# Patient Record
Sex: Male | Born: 1990 | Race: Black or African American | Hispanic: No | Marital: Single | Smoking: Current every day smoker
Health system: Southern US, Community
[De-identification: ages and names within clinical notes are randomized; demographics above are authoritative.]

## PROBLEM LIST (undated history)

## (undated) DIAGNOSIS — Z9889 Other specified postprocedural states: Secondary | ICD-10-CM

## (undated) DIAGNOSIS — W3400XA Accidental discharge from unspecified firearms or gun, initial encounter: Secondary | ICD-10-CM

---
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
## 2011-05-17 ENCOUNTER — Emergency Department (HOSPITAL_COMMUNITY): Payer: Self-pay

## 2011-05-17 ENCOUNTER — Inpatient Hospital Stay (HOSPITAL_COMMUNITY)
Admission: EM | Admit: 2011-05-17 | Discharge: 2011-05-19 | DRG: 605 | Disposition: A | Discharge status: Home / Self Care | Payer: Self-pay | Attending: General Surgery | Admitting: General Surgery

## 2011-05-17 DIAGNOSIS — Z23 Encounter for immunization: Secondary | ICD-10-CM

## 2011-05-17 DIAGNOSIS — D62 Acute posthemorrhagic anemia: Secondary | ICD-10-CM | POA: Diagnosis present

## 2011-05-17 DIAGNOSIS — S21209A Unspecified open wound of unspecified back wall of thorax without penetration into thoracic cavity, initial encounter: Principal | ICD-10-CM | POA: Diagnosis present

## 2011-05-17 LAB — TYPE AND SCREEN: Unit division: 0

## 2011-05-17 LAB — POCT I-STAT, CHEM 8
BUN: 9 mg/dL (ref 6–23)
Calcium, Ion: 1.11 mmol/L — ABNORMAL LOW (ref 1.12–1.32)
Creatinine, Ser: 1.1 mg/dL (ref 0.4–1.5)
Sodium: 142 mEq/L (ref 135–145)
TCO2: 16 mmol/L (ref 0–100)

## 2011-05-17 LAB — CBC
MCH: 33.9 pg (ref 26.0–34.0)
MCV: 93.6 fL (ref 78.0–100.0)
Platelets: 247 10*3/uL (ref 150–400)
RBC: 4.66 MIL/uL (ref 4.22–5.81)

## 2011-05-18 ENCOUNTER — Observation Stay (HOSPITAL_COMMUNITY): Payer: Self-pay

## 2011-05-18 LAB — COMPREHENSIVE METABOLIC PANEL
ALT: 8 U/L (ref 0–53)
AST: 18 U/L (ref 0–37)
Alkaline Phosphatase: 74 U/L (ref 39–117)
CO2: 17 mEq/L — ABNORMAL LOW (ref 19–32)
Chloride: 100 mEq/L (ref 96–112)
Creatinine, Ser: 1.03 mg/dL (ref 0.4–1.5)
GFR calc Af Amer: >60 mL/min (ref >60)
GFR calc non Af Amer: >60 mL/min (ref >60)
Sodium: 142 mEq/L (ref 135–145)
Total Bilirubin: 0.6 mg/dL (ref 0.3–1.2)

## 2011-05-18 LAB — CBC
MCHC: 35.8 g/dL (ref 30.0–36.0)
Platelets: 186 10*3/uL (ref 150–400)
RDW: 12.1 % (ref 11.5–15.5)

## 2011-05-19 LAB — CBC
Platelets: 185 10*3/uL (ref 150–400)
RBC: 3.68 MIL/uL — ABNORMAL LOW (ref 4.22–5.81)
WBC: 8.2 10*3/uL (ref 4.0–10.5)

## 2011-05-19 LAB — BASIC METABOLIC PANEL
Chloride: 102 mEq/L (ref 96–112)
GFR calc Af Amer: >60 mL/min (ref >60)
GFR calc non Af Amer: >60 mL/min (ref >60)
Potassium: 3.6 mEq/L (ref 3.5–5.1)
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
## 2014-02-27 HISTORY — PX: OTHER SURGICAL HISTORY: SHX169

## 2014-03-07 ENCOUNTER — Emergency Department (HOSPITAL_COMMUNITY): Payer: No Typology Code available for payment source | Admitting: Registered Nurse

## 2014-03-07 ENCOUNTER — Ambulatory Visit (HOSPITAL_COMMUNITY)
Admission: EM | Admit: 2014-03-07 | Discharge: 2014-03-07 | Disposition: A | Discharge status: Other Acute Inpatient Hospital | Payer: No Typology Code available for payment source | Source: Home / Self Care | Attending: Emergency Medicine | Admitting: Emergency Medicine

## 2014-03-07 ENCOUNTER — Encounter (HOSPITAL_COMMUNITY): Payer: No Typology Code available for payment source | Admitting: Registered Nurse

## 2014-03-07 ENCOUNTER — Inpatient Hospital Stay (HOSPITAL_COMMUNITY): Payer: No Typology Code available for payment source

## 2014-03-07 ENCOUNTER — Encounter (HOSPITAL_COMMUNITY)
Admission: EM | Disposition: A | Discharge status: Other Acute Inpatient Hospital | Payer: Self-pay | Source: Home / Self Care | Attending: Emergency Medicine

## 2014-03-07 ENCOUNTER — Encounter (HOSPITAL_COMMUNITY): Payer: Self-pay | Admitting: Registered Nurse

## 2014-03-07 ENCOUNTER — Emergency Department (HOSPITAL_COMMUNITY): Payer: No Typology Code available for payment source

## 2014-03-07 ENCOUNTER — Inpatient Hospital Stay (HOSPITAL_COMMUNITY)
Admission: EM | Admit: 2014-03-07 | Discharge: 2014-03-12 | DRG: 958 | Disposition: A | Discharge status: Home / Self Care | Payer: No Typology Code available for payment source | Attending: General Surgery | Admitting: General Surgery

## 2014-03-07 DIAGNOSIS — J986 Disorders of diaphragm: Secondary | ICD-10-CM

## 2014-03-07 DIAGNOSIS — S21109A Unspecified open wound of unspecified front wall of thorax without penetration into thoracic cavity, initial encounter: Secondary | ICD-10-CM | POA: Insufficient documentation

## 2014-03-07 DIAGNOSIS — S21139A Puncture wound without foreign body of unspecified front wall of thorax without penetration into thoracic cavity, initial encounter: Principal | ICD-10-CM

## 2014-03-07 DIAGNOSIS — R61 Generalized hyperhidrosis: Secondary | ICD-10-CM | POA: Insufficient documentation

## 2014-03-07 DIAGNOSIS — J9383 Other pneumothorax: Secondary | ICD-10-CM

## 2014-03-07 DIAGNOSIS — S270XXA Traumatic pneumothorax, initial encounter: Secondary | ICD-10-CM | POA: Diagnosis present

## 2014-03-07 DIAGNOSIS — S3609XA Other injury of spleen, initial encounter: Secondary | ICD-10-CM | POA: Diagnosis present

## 2014-03-07 DIAGNOSIS — S31109A Unspecified open wound of abdominal wall, unspecified quadrant without penetration into peritoneal cavity, initial encounter: Principal | ICD-10-CM | POA: Diagnosis present

## 2014-03-07 DIAGNOSIS — R5083 Postvaccination fever: Secondary | ICD-10-CM | POA: Diagnosis present

## 2014-03-07 DIAGNOSIS — S36039A Unspecified laceration of spleen, initial encounter: Secondary | ICD-10-CM

## 2014-03-07 DIAGNOSIS — F17200 Nicotine dependence, unspecified, uncomplicated: Secondary | ICD-10-CM | POA: Insufficient documentation

## 2014-03-07 DIAGNOSIS — K560 Paralytic ileus: Secondary | ICD-10-CM | POA: Diagnosis not present

## 2014-03-07 DIAGNOSIS — Y849 Medical procedure, unspecified as the cause of abnormal reaction of the patient, or of later complication, without mention of misadventure at the time of the procedure: Secondary | ICD-10-CM | POA: Diagnosis present

## 2014-03-07 DIAGNOSIS — S31139A Puncture wound of abdominal wall without foreign body, unspecified quadrant without penetration into peritoneal cavity, initial encounter: Secondary | ICD-10-CM

## 2014-03-07 DIAGNOSIS — W3400XA Accidental discharge from unspecified firearms or gun, initial encounter: Secondary | ICD-10-CM

## 2014-03-07 DIAGNOSIS — I9589 Other hypotension: Secondary | ICD-10-CM

## 2014-03-07 DIAGNOSIS — D62 Acute posthemorrhagic anemia: Secondary | ICD-10-CM | POA: Diagnosis present

## 2014-03-07 DIAGNOSIS — Y9229 Other specified public building as the place of occurrence of the external cause: Secondary | ICD-10-CM | POA: Insufficient documentation

## 2014-03-07 DIAGNOSIS — S27809A Unspecified injury of diaphragm, initial encounter: Secondary | ICD-10-CM | POA: Diagnosis present

## 2014-03-07 DIAGNOSIS — S36899A Unspecified injury of other intra-abdominal organs, initial encounter: Secondary | ICD-10-CM

## 2014-03-07 DIAGNOSIS — F1210 Cannabis abuse, uncomplicated: Secondary | ICD-10-CM | POA: Diagnosis present

## 2014-03-07 DIAGNOSIS — Z9081 Acquired absence of spleen: Secondary | ICD-10-CM

## 2014-03-07 DIAGNOSIS — S3600XA Unspecified injury of spleen, initial encounter: Secondary | ICD-10-CM | POA: Diagnosis present

## 2014-03-07 DIAGNOSIS — S272XXA Traumatic hemopneumothorax, initial encounter: Secondary | ICD-10-CM | POA: Diagnosis present

## 2014-03-07 HISTORY — PX: SPLENECTOMY, TOTAL: SHX788

## 2014-03-07 HISTORY — PX: LAPAROTOMY: SHX154

## 2014-03-07 LAB — CBC WITH DIFFERENTIAL/PLATELET
BASOS ABS: 0 10*3/uL (ref 0.0–0.1)
Basophils Relative: 0 % (ref 0–1)
Eosinophils Absolute: 0.1 10*3/uL (ref 0.0–0.7)
Eosinophils Relative: 1 % (ref 0–5)
HCT: 41.5 % (ref 39.0–52.0)
Hemoglobin: 14.6 g/dL (ref 13.0–17.0)
Lymphocytes Relative: 21 % (ref 12–46)
Lymphs Abs: 2.3 10*3/uL (ref 0.7–4.0)
MCH: 34.1 pg — ABNORMAL HIGH (ref 26.0–34.0)
MCHC: 35.2 g/dL (ref 30.0–36.0)
MCV: 97 fL (ref 78.0–100.0)
Monocytes Absolute: 0.5 10*3/uL (ref 0.1–1.0)
Monocytes Relative: 4 % (ref 3–12)
NEUTROS ABS: 8.2 10*3/uL — AB (ref 1.7–7.7)
Neutrophils Relative %: 74 % (ref 43–77)
PLATELETS: 199 10*3/uL (ref 150–400)
RBC: 4.28 MIL/uL (ref 4.22–5.81)
RDW: 12.6 % (ref 11.5–15.5)
WBC: 11 10*3/uL — ABNORMAL HIGH (ref 4.0–10.5)

## 2014-03-07 LAB — TYPE AND SCREEN
ABO/RH(D): O POS
Antibody Screen: NEGATIVE
UNIT DIVISION: 0
Unit division: 0

## 2014-03-07 LAB — PREPARE FRESH FROZEN PLASMA
UNIT DIVISION: 0
Unit division: 0
Unit division: 0
Unit division: 0

## 2014-03-07 LAB — CBC
HCT: 40.6 % (ref 39.0–52.0)
Hemoglobin: 14.2 g/dL (ref 13.0–17.0)
MCH: 33.3 pg (ref 26.0–34.0)
MCHC: 35 g/dL (ref 30.0–36.0)
MCV: 95.1 fL (ref 78.0–100.0)
Platelets: 175 10*3/uL (ref 150–400)
RBC: 4.27 MIL/uL (ref 4.22–5.81)
RDW: 14.9 % (ref 11.5–15.5)
WBC: 30.9 10*3/uL — ABNORMAL HIGH (ref 4.0–10.5)

## 2014-03-07 LAB — I-STAT CHEM 8, ED
BUN: 12 mg/dL (ref 6–23)
CHLORIDE: 101 meq/L (ref 96–112)
Calcium, Ion: 1.12 mmol/L (ref 1.12–1.23)
Creatinine, Ser: 1.2 mg/dL (ref 0.50–1.35)
Glucose, Bld: 154 mg/dL — ABNORMAL HIGH (ref 70–99)
HEMATOCRIT: 46 % (ref 39.0–52.0)
Hemoglobin: 15.6 g/dL (ref 13.0–17.0)
Potassium: 3 mEq/L — ABNORMAL LOW (ref 3.7–5.3)
SODIUM: 140 meq/L (ref 137–147)
TCO2: 22 mmol/L (ref 0–100)

## 2014-03-07 LAB — MASSIVE TRANSFUSION PROTOCOL ORDER (BLOOD BANK NOTIFICATION)

## 2014-03-07 LAB — BASIC METABOLIC PANEL
BUN: 9 mg/dL (ref 6–23)
CALCIUM: 7.5 mg/dL — AB (ref 8.4–10.5)
CHLORIDE: 103 meq/L (ref 96–112)
CO2: 20 mEq/L (ref 19–32)
CREATININE: 0.73 mg/dL (ref 0.50–1.35)
GFR calc non Af Amer: >90 mL/min (ref >90)
Glucose, Bld: 116 mg/dL — ABNORMAL HIGH (ref 70–99)
Potassium: 4.3 mEq/L (ref 3.7–5.3)
Sodium: 139 mEq/L (ref 137–147)

## 2014-03-07 LAB — COMPREHENSIVE METABOLIC PANEL
ALBUMIN: 4.3 g/dL (ref 3.5–5.2)
ALK PHOS: 70 U/L (ref 39–117)
ALT: 6 U/L (ref 0–53)
AST: 14 U/L (ref 0–37)
BUN: 12 mg/dL (ref 6–23)
CALCIUM: 8.7 mg/dL (ref 8.4–10.5)
CO2: 21 mEq/L (ref 19–32)
Chloride: 101 mEq/L (ref 96–112)
Creatinine, Ser: 1.01 mg/dL (ref 0.50–1.35)
GFR calc Af Amer: >90 mL/min (ref >90)
GFR calc non Af Amer: >90 mL/min (ref >90)
Glucose, Bld: 155 mg/dL — ABNORMAL HIGH (ref 70–99)
POTASSIUM: 3.4 meq/L — AB (ref 3.7–5.3)
SODIUM: 142 meq/L (ref 137–147)
TOTAL PROTEIN: 6.9 g/dL (ref 6.0–8.3)
Total Bilirubin: 0.3 mg/dL (ref 0.3–1.2)

## 2014-03-07 LAB — POCT I-STAT 4, (NA,K, GLUC, HGB,HCT)
GLUCOSE: 165 mg/dL — AB (ref 70–99)
HCT: 33 % — ABNORMAL LOW (ref 39.0–52.0)
Hemoglobin: 11.2 g/dL — ABNORMAL LOW (ref 13.0–17.0)
Potassium: 3.8 mEq/L (ref 3.7–5.3)
Sodium: 141 mEq/L (ref 137–147)

## 2014-03-07 LAB — ETHANOL: ALCOHOL ETHYL (B): 71 mg/dL — AB (ref 0–11)

## 2014-03-07 LAB — I-STAT CG4 LACTIC ACID, ED: LACTIC ACID, VENOUS: 5.07 mmol/L — AB (ref 0.5–2.2)

## 2014-03-07 LAB — ABO/RH
ABO/RH(D): O POS
ABO/RH(D): O POS

## 2014-03-07 LAB — PREPARE RBC (CROSSMATCH)

## 2014-03-07 LAB — MRSA PCR SCREENING: MRSA by PCR: NEGATIVE

## 2014-03-07 SURGERY — LAPAROTOMY, EXPLORATORY
Anesthesia: General

## 2014-03-07 MED ORDER — HYDROMORPHONE HCL PF 1 MG/ML IJ SOLN
0.2500 mg | INTRAMUSCULAR | Status: DC | PRN
  Administered 2014-03-07 (×4): 0.5 mg via INTRAVENOUS

## 2014-03-07 MED ORDER — GLYCOPYRROLATE 0.2 MG/ML IJ SOLN
INTRAMUSCULAR | Status: DC | PRN
  Administered 2014-03-07: .6 mg via INTRAVENOUS

## 2014-03-07 MED ORDER — ONDANSETRON HCL 4 MG/2ML IJ SOLN
INTRAMUSCULAR | Status: DC | PRN
  Administered 2014-03-07: 4 mg via INTRAVENOUS

## 2014-03-07 MED ORDER — SODIUM CHLORIDE 0.9 % IV SOLN
INTRAVENOUS | Status: DC | PRN
  Administered 2014-03-07: 03:00:00 via INTRAVENOUS

## 2014-03-07 MED ORDER — PROMETHAZINE HCL 25 MG/ML IJ SOLN: 6.2500 mg | INTRAMUSCULAR | Status: DC | PRN

## 2014-03-07 MED ORDER — CEFAZOLIN SODIUM-DEXTROSE 2-3 GM-% IV SOLR
2.0000 g | Freq: Once | INTRAVENOUS | Status: DC
  Administered 2014-03-07: 2 g via INTRAVENOUS

## 2014-03-07 MED ORDER — ONDANSETRON HCL 4 MG/2ML IJ SOLN: 4.0000 mg | Freq: Once | INTRAMUSCULAR | Status: DC

## 2014-03-07 MED ORDER — KETAMINE HCL 10 MG/ML IJ SOLN
INTRAMUSCULAR | Status: DC | PRN
  Administered 2014-03-07 (×2): 20 mg via INTRAVENOUS

## 2014-03-07 MED ORDER — PANTOPRAZOLE SODIUM 40 MG PO TBEC: 40.0000 mg | DELAYED_RELEASE_TABLET | Freq: Every day | ORAL | Status: DC

## 2014-03-07 MED ORDER — PANTOPRAZOLE SODIUM 40 MG PO TBEC: 40.0000 mg | DELAYED_RELEASE_TABLET | ORAL | Status: DC

## 2014-03-07 MED ORDER — HYDROMORPHONE HCL PF 1 MG/ML IJ SOLN
INTRAMUSCULAR | Status: AC
  Filled 2014-03-07: qty 1

## 2014-03-07 MED ORDER — LIDOCAINE HCL (CARDIAC) 20 MG/ML IV SOLN
INTRAVENOUS | Status: DC | PRN
  Administered 2014-03-07: 100 mg via INTRAVENOUS

## 2014-03-07 MED ORDER — ROCURONIUM BROMIDE 100 MG/10ML IV SOLN
INTRAVENOUS | Status: DC | PRN
  Administered 2014-03-07: 50 mg via INTRAVENOUS

## 2014-03-07 MED ORDER — KCL IN DEXTROSE-NACL 20-5-0.9 MEQ/L-%-% IV SOLN
INTRAVENOUS | Status: DC
  Administered 2014-03-07 – 2014-03-09 (×5): via INTRAVENOUS
  Filled 2014-03-07 (×7): qty 1000

## 2014-03-07 MED ORDER — LACTATED RINGERS IV SOLN
INTRAVENOUS | Status: DC | PRN
  Administered 2014-03-07: 03:00:00 via INTRAVENOUS

## 2014-03-07 MED ORDER — MORPHINE SULFATE 4 MG/ML IJ SOLN
4.0000 mg | INTRAMUSCULAR | Status: DC | PRN
  Administered 2014-03-07 – 2014-03-09 (×12): 4 mg via INTRAVENOUS
  Filled 2014-03-07 (×13): qty 1

## 2014-03-07 MED ORDER — ONDANSETRON HCL 4 MG PO TABS: 4.0000 mg | ORAL_TABLET | Freq: Four times a day (QID) | ORAL | Status: DC | PRN

## 2014-03-07 MED ORDER — PNEUMOCOCCAL VAC POLYVALENT 25 MCG/0.5ML IJ INJ
0.5000 mL | INJECTION | INTRAMUSCULAR | Status: AC
  Administered 2014-03-08: 0.5 mL via INTRAMUSCULAR
  Filled 2014-03-07: qty 0.5

## 2014-03-07 MED ORDER — ONDANSETRON HCL 4 MG/2ML IJ SOLN: 4.0000 mg | Freq: Four times a day (QID) | INTRAMUSCULAR | Status: DC | PRN

## 2014-03-07 MED ORDER — PANTOPRAZOLE SODIUM 40 MG IV SOLR
40.0000 mg | INTRAVENOUS | Status: DC
  Administered 2014-03-07 – 2014-03-08 (×2): 40 mg via INTRAVENOUS
  Filled 2014-03-07 (×3): qty 40

## 2014-03-07 MED ORDER — PANTOPRAZOLE SODIUM 40 MG IV SOLR: 40.0000 mg | INTRAVENOUS | Status: DC

## 2014-03-07 MED ORDER — KCL IN DEXTROSE-NACL 20-5-0.9 MEQ/L-%-% IV SOLN: INTRAVENOUS | Status: DC

## 2014-03-07 MED ORDER — LACTATED RINGERS IV SOLN: INTRAVENOUS | Status: DC

## 2014-03-07 MED ORDER — PHENYLEPHRINE HCL 10 MG/ML IJ SOLN
20.0000 mg | INTRAVENOUS | Status: DC | PRN
  Administered 2014-03-07: 5 ug/min via INTRAVENOUS

## 2014-03-07 MED ORDER — SUCCINYLCHOLINE CHLORIDE 20 MG/ML IJ SOLN
INTRAMUSCULAR | Status: DC | PRN
  Administered 2014-03-07: 100 mg via INTRAVENOUS

## 2014-03-07 MED ORDER — SODIUM CHLORIDE 0.9 % IR SOLN
Status: DC | PRN
  Administered 2014-03-07 (×4): 1000 mL

## 2014-03-07 MED ORDER — ONDANSETRON HCL 4 MG PO TABS: 4.0000 mg | ORAL_TABLET | Freq: Once | ORAL | Status: DC

## 2014-03-07 MED ORDER — INFLUENZA VAC SPLIT QUAD 0.5 ML IM SUSP
0.5000 mL | INTRAMUSCULAR | Status: AC
  Administered 2014-03-08: 0.5 mL via INTRAMUSCULAR
  Filled 2014-03-07: qty 0.5

## 2014-03-07 MED ORDER — NEOSTIGMINE METHYLSULFATE 1 MG/ML IJ SOLN
INTRAMUSCULAR | Status: DC | PRN
  Administered 2014-03-07: 4 mg via INTRAVENOUS

## 2014-03-07 MED ORDER — FENTANYL CITRATE 0.05 MG/ML IJ SOLN
INTRAMUSCULAR | Status: DC | PRN
  Administered 2014-03-07: 50 ug via INTRAVENOUS
  Administered 2014-03-07: 100 ug via INTRAVENOUS
  Administered 2014-03-07 (×2): 50 ug via INTRAVENOUS

## 2014-03-07 MED ORDER — HYDROMORPHONE HCL PF 1 MG/ML IJ SOLN
INTRAMUSCULAR | Status: DC | PRN
  Administered 2014-03-07: 0.5 mg via INTRAVENOUS

## 2014-03-07 MED ORDER — ETOMIDATE 2 MG/ML IV SOLN
INTRAVENOUS | Status: DC | PRN
  Administered 2014-03-07: 16 mg via INTRAVENOUS

## 2014-03-07 SURGICAL SUPPLY — 42 items
APPLICATOR COTTON TIP 6IN STRL (MISCELLANEOUS) ×3 IMPLANT
BLADE EXTENDED COATED 6.5IN (ELECTRODE) ×2 IMPLANT
BLADE HEX COATED 2.75 (ELECTRODE) ×3 IMPLANT
CANISTER SUCTION 2500CC (MISCELLANEOUS) ×3 IMPLANT
COVER MAYO STAND STRL (DRAPES) ×2 IMPLANT
DRAIN CHANNEL 19F RND (DRAIN) ×2 IMPLANT
DRAPE LAPAROSCOPIC ABDOMINAL (DRAPES) ×3 IMPLANT
DRAPE WARM FLUID 44X44 (DRAPE) ×2 IMPLANT
ELECT REM PT RETURN 9FT ADLT (ELECTROSURGICAL) ×3
ELECTRODE REM PT RTRN 9FT ADLT (ELECTROSURGICAL) ×1 IMPLANT
EVACUATOR SILICONE 100CC (DRAIN) ×2 IMPLANT
GAUZE VASELINE 1X8 (GAUZE/BANDAGES/DRESSINGS) ×2 IMPLANT
GLOVE BIO SURGEON STRL SZ7.5 (GLOVE) ×6 IMPLANT
GLOVE BIOGEL PI IND STRL 7.0 (GLOVE) ×1 IMPLANT
GLOVE BIOGEL PI INDICATOR 7.0 (GLOVE) ×4
GLOVE SURG SS PI 8.5 STRL IVOR (GLOVE) ×4
GLOVE SURG SS PI 8.5 STRL STRW (GLOVE) IMPLANT
GOWN STRL REUS W/ TWL XL LVL3 (GOWN DISPOSABLE) ×1 IMPLANT
GOWN STRL REUS W/TWL LRG LVL3 (GOWN DISPOSABLE) ×3 IMPLANT
GOWN STRL REUS W/TWL XL LVL3 (GOWN DISPOSABLE) ×6 IMPLANT
KIT BASIN OR (CUSTOM PROCEDURE TRAY) ×3 IMPLANT
NS IRRIG 1000ML POUR BTL (IV SOLUTION) ×11 IMPLANT
PACK GENERAL/GYN (CUSTOM PROCEDURE TRAY) ×3 IMPLANT
SPONGE GAUZE 4X4 12PLY (GAUZE/BANDAGES/DRESSINGS) ×3 IMPLANT
SPONGE LAP 18X18 X RAY DECT (DISPOSABLE) ×10 IMPLANT
STAPLER VISISTAT 35W (STAPLE) ×5 IMPLANT
SUCTION POOLE TIP (SUCTIONS) ×2 IMPLANT
SUT ETHILON 2 0 PS N (SUTURE) ×2 IMPLANT
SUT PDS AB 1 CTX 36 (SUTURE) ×4 IMPLANT
SUT SILK 0 SH 30 (SUTURE) ×2 IMPLANT
SUT SILK 2 0 (SUTURE) ×6
SUT SILK 2 0 SH CR/8 (SUTURE) ×4 IMPLANT
SUT SILK 2-0 18XBRD TIE 12 (SUTURE) IMPLANT
SUT SILK 3 0 (SUTURE)
SUT SILK 3 0 SH CR/8 (SUTURE) IMPLANT
SUT SILK 3-0 18XBRD TIE 12 (SUTURE) IMPLANT
SYSTEM SAHARA CHEST DRAIN ATS (WOUND CARE) ×2 IMPLANT
TAPE CLOTH SURG 4X10 WHT LF (GAUZE/BANDAGES/DRESSINGS) ×2 IMPLANT
TOWEL OR 17X26 10 PK STRL BLUE (TOWEL DISPOSABLE) ×6 IMPLANT
TRAY FOLEY CATH 14FRSI W/METER (CATHETERS) ×2 IMPLANT
WATER STERILE IRR 1500ML POUR (IV SOLUTION) ×3 IMPLANT
YANKAUER SUCT BULB TIP NO VENT (SUCTIONS) ×2 IMPLANT
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
## 2014-03-07 NOTE — Progress Notes (Signed)
Briefly took a look at this patient and he is completely stable.  Will possibly move out of the ICU later today.  Marta LamasJames O. Gae BonWyatt, III, MD, FACS [phone] Trauma Surgeon
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
## 2014-03-07 NOTE — OR Nursing (Signed)
Surgical emergency brought straight to OR. No timeout or count performed.  Flat plate x-ray ordered at end of case for verification.

## 2014-03-07 NOTE — ED Notes (Signed)
Unable to obtain manual blood pressure on patient at this time.
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
## 2014-03-07 NOTE — Progress Notes (Signed)
Silver Oceanographergem earring given to pt's mother.   Holly Bodilyulbertson, Gustabo Gordillo Leigh

## 2014-03-07 NOTE — Progress Notes (Signed)
Orthopedic Tech Progress Note Patient Details:  Eddie Jimenez 07-17-1991 [id number] Ankle ASO applied to Right LE as ordered. Application tolerated well.  Ortho Devices Type of Ortho Device: ASO Ortho Device/Splint Location: Right LE Ortho Device/Splint Interventions: Application   Asia R Thompson 03/07/2014, 3:23 PM
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
## 2014-03-07 NOTE — Anesthesia Preprocedure Evaluation (Addendum)
Anesthesia Evaluation  Patient identified by MRN, date of birth, ID band Patient awake    Reviewed: Allergy & Precautions, H&P , NPO status , Patient's Chart, lab work & pertinent test results, reviewed documented beta blocker date and time   Airway Mallampati: I TM Distance: >3 FB Neck ROM: full    Dental no notable dental hx. (+) Teeth Intact, Dental Advisory Given   Pulmonary neg pulmonary ROS, Current Smoker,  breath sounds clear to auscultation  Pulmonary exam normal       Cardiovascular Exercise Tolerance: Good negative cardio ROS  Rhythm:regular Rate:Normal     Neuro/Psych negative neurological ROS  negative psych ROS   GI/Hepatic negative GI ROS, Neg liver ROS,   Endo/Other  negative endocrine ROS  Renal/GU negative Renal ROS  negative genitourinary   Musculoskeletal   Abdominal Normal abdominal exam  (+)   Peds  Hematology negative hematology ROS (+)   Anesthesia Other Findings   Reproductive/Obstetrics negative OB ROS                          Anesthesia Physical Anesthesia Plan  ASA: I and emergent  Anesthesia Plan: General   Post-op Pain Management:    Induction: Intravenous, Rapid sequence and Cricoid pressure planned  Airway Management Planned: Oral ETT  Additional Equipment: Arterial line  Intra-op Plan:   Post-operative Plan: Extubation in OR and Possible Post-op intubation/ventilation  Informed Consent: I have reviewed the patients History and Physical, chart, labs and discussed the procedure including the risks, benefits and alternatives for the proposed anesthesia with the patient or authorized representative who has indicated his/her understanding and acceptance.   Dental advisory given  Plan Discussed with: CRNA  Anesthesia Plan Comments:         Anesthesia Quick Evaluation
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
## 2014-03-07 NOTE — OR Nursing (Signed)
Xray performed at 0442.  MD made aware.

## 2014-03-07 NOTE — Preoperative (Signed)
Beta Blockers   Reason not to administer Beta Blockers:Not Applicable 

## 2014-03-08 ENCOUNTER — Inpatient Hospital Stay (HOSPITAL_COMMUNITY): Payer: No Typology Code available for payment source

## 2014-03-08 DIAGNOSIS — D62 Acute posthemorrhagic anemia: Secondary | ICD-10-CM

## 2014-03-08 LAB — BASIC METABOLIC PANEL
BUN: 4 mg/dL — ABNORMAL LOW (ref 6–23)
CALCIUM: 7.9 mg/dL — AB (ref 8.4–10.5)
CO2: 26 meq/L (ref 19–32)
CREATININE: 0.62 mg/dL (ref 0.50–1.35)
Chloride: 103 mEq/L (ref 96–112)
GFR calc Af Amer: >90 mL/min (ref >90)
GFR calc non Af Amer: >90 mL/min (ref >90)
GLUCOSE: 107 mg/dL — AB (ref 70–99)
Potassium: 4.1 mEq/L (ref 3.7–5.3)
Sodium: 137 mEq/L (ref 137–147)

## 2014-03-08 LAB — TYPE AND SCREEN
ABO/RH(D): O POS
ANTIBODY SCREEN: NEGATIVE
UNIT DIVISION: 0
Unit division: 0
Unit division: 0
Unit division: 0
Unit division: 0
Unit division: 0

## 2014-03-08 LAB — CBC
HCT: 33.5 % — ABNORMAL LOW (ref 39.0–52.0)
HCT: 34.3 % — ABNORMAL LOW (ref 39.0–52.0)
HEMOGLOBIN: 11.7 g/dL — AB (ref 13.0–17.0)
HEMOGLOBIN: 11.9 g/dL — AB (ref 13.0–17.0)
MCH: 32.8 pg (ref 26.0–34.0)
MCH: 32.4 pg (ref 26.0–34.0)
MCHC: 34.9 g/dL (ref 30.0–36.0)
MCHC: 34.7 g/dL (ref 30.0–36.0)
MCV: 93.8 fL (ref 78.0–100.0)
MCV: 93.5 fL (ref 78.0–100.0)
Platelets: 151 10*3/uL (ref 150–400)
Platelets: 147 10*3/uL — ABNORMAL LOW (ref 150–400)
RBC: 3.57 MIL/uL — AB (ref 4.22–5.81)
RBC: 3.67 MIL/uL — ABNORMAL LOW (ref 4.22–5.81)
RDW: 14.6 % (ref 11.5–15.5)
RDW: 13.9 % (ref 11.5–15.5)
WBC: 22.6 10*3/uL — ABNORMAL HIGH (ref 4.0–10.5)
WBC: 26.9 10*3/uL — AB (ref 4.0–10.5)

## 2014-03-08 SURGERY — LAPAROTOMY, EXPLORATORY
Anesthesia: General

## 2014-03-08 MED ORDER — WHITE PETROLATUM GEL
Status: AC
  Filled 2014-03-08: qty 5

## 2014-03-08 MED ORDER — OXYCODONE HCL 5 MG PO TABS
5.0000 mg | ORAL_TABLET | ORAL | Status: DC | PRN
  Administered 2014-03-08 (×2): 10 mg via ORAL
  Filled 2014-03-08 (×4): qty 2
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
## 2014-03-08 NOTE — Progress Notes (Signed)
UR completed.  Tala Eber, RN BSN MHA CCM Trauma/Neuro ICU Case Manager [phone]  
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
## 2014-03-09 ENCOUNTER — Encounter (HOSPITAL_COMMUNITY): Payer: Self-pay | Admitting: General Surgery

## 2014-03-09 ENCOUNTER — Inpatient Hospital Stay (HOSPITAL_COMMUNITY): Payer: No Typology Code available for payment source

## 2014-03-09 DIAGNOSIS — S27809A Unspecified injury of diaphragm, initial encounter: Secondary | ICD-10-CM | POA: Diagnosis present

## 2014-03-09 DIAGNOSIS — D62 Acute posthemorrhagic anemia: Secondary | ICD-10-CM | POA: Diagnosis not present

## 2014-03-09 DIAGNOSIS — S272XXA Traumatic hemopneumothorax, initial encounter: Secondary | ICD-10-CM | POA: Diagnosis present

## 2014-03-09 DIAGNOSIS — S3600XA Unspecified injury of spleen, initial encounter: Secondary | ICD-10-CM | POA: Diagnosis present

## 2014-03-09 MED ORDER — MORPHINE SULFATE 2 MG/ML IJ SOLN
2.0000 mg | INTRAMUSCULAR | Status: DC | PRN
  Administered 2014-03-10: 2 mg via INTRAVENOUS
  Filled 2014-03-09: qty 1

## 2014-03-09 MED ORDER — ENOXAPARIN SODIUM 40 MG/0.4ML ~~LOC~~ SOLN
40.0000 mg | SUBCUTANEOUS | Status: DC
  Administered 2014-03-09 – 2014-03-11 (×3): 40 mg via SUBCUTANEOUS
  Filled 2014-03-09 (×5): qty 0.4

## 2014-03-09 MED ORDER — POLYETHYLENE GLYCOL 3350 17 G PO PACK
17.0000 g | PACK | Freq: Every day | ORAL | Status: DC
  Administered 2014-03-09 – 2014-03-12 (×4): 17 g via ORAL
  Filled 2014-03-09 (×4): qty 1

## 2014-03-09 MED ORDER — OXYCODONE HCL 5 MG PO TABS
5.0000 mg | ORAL_TABLET | ORAL | Status: DC | PRN
  Administered 2014-03-09: 15 mg via ORAL
  Administered 2014-03-09: 10 mg via ORAL
  Administered 2014-03-09 – 2014-03-12 (×9): 15 mg via ORAL
  Filled 2014-03-09 (×3): qty 3
  Filled 2014-03-09: qty 2
  Filled 2014-03-09 (×8): qty 3

## 2014-03-09 MED ORDER — DOCUSATE SODIUM 100 MG PO CAPS
100.0000 mg | ORAL_CAPSULE | Freq: Two times a day (BID) | ORAL | Status: DC
  Administered 2014-03-09 – 2014-03-12 (×6): 100 mg via ORAL
  Filled 2014-03-09 (×5): qty 1

## 2014-03-09 NOTE — Progress Notes (Signed)
Advance to fulls. Encouraged ambulation. JP remains sanguinous. PT working with him as well. Need better documentation of CT output. Patient examined and I agree with the assessment and plan  Violeta GelinasBurke Thompson, MD, MPH, FACS Trauma: [phone] General Surgery: [phone]  03/09/2014 11:10 AM

## 2014-03-09 NOTE — Anesthesia Postprocedure Evaluation (Signed)
Anesthesia Post Note  Patient: Eddie Jimenez  Procedure(s) Performed: Procedure(s) (LRB): EXPLORATORY LAPAROTOMY (N/A) SPLENECTOMY (N/A)  Anesthesia type: General  Patient location: PACU  Post pain: Pain level controlled  Post assessment: Post-op Vital signs reviewed  Last Vitals:  Filed Vitals:   03/07/14 0730  BP:   Pulse:   Temp: 36.6 C  Resp:     Post vital signs: Reviewed  Level of consciousness: sedated  Complications: No apparent anesthesia complications
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
## 2014-03-10 ENCOUNTER — Encounter (HOSPITAL_COMMUNITY): Payer: Self-pay | Admitting: General Practice

## 2014-03-10 ENCOUNTER — Inpatient Hospital Stay (HOSPITAL_COMMUNITY): Payer: No Typology Code available for payment source

## 2014-03-10 MED ORDER — MENINGOCOCCAL A C Y&W-135 OLIG IM SOLR
0.5000 mL | Freq: Once | INTRAMUSCULAR | Status: AC
  Administered 2014-03-10: 0.5 mL via INTRAMUSCULAR
  Filled 2014-03-10: qty 0.5

## 2014-03-10 MED ORDER — HAEMOPHILUS B POLYSAC CONJ VAC IM SOLR
0.5000 mL | Freq: Once | INTRAMUSCULAR | Status: AC
  Administered 2014-03-10: 0.5 mL via INTRAMUSCULAR
  Filled 2014-03-10: qty 0.5

## 2014-03-10 MED ORDER — PNEUMOCOCCAL VAC POLYVALENT 25 MCG/0.5ML IJ INJ: 0.5000 mL | INJECTION | INTRAMUSCULAR | Status: DC

## 2014-03-10 MED ORDER — PNEUMOCOCCAL 13-VAL CONJ VACC IM SUSP
0.5000 mL | Freq: Once | INTRAMUSCULAR | Status: AC
  Administered 2014-03-10: 0.5 mL via INTRAMUSCULAR
  Filled 2014-03-10: qty 0.5

## 2014-03-10 MED ORDER — MENINGOCOCCAL VAC A,C,Y,W-135 ~~LOC~~ INJ: 0.5000 mL | INJECTION | Freq: Once | SUBCUTANEOUS | Status: DC

## 2014-03-10 NOTE — Progress Notes (Signed)
Bullet palpable within L paraspinous muscles. A bit dizzy when OOB. Hopefully he will be ready for D/C in the AM. D/C JP then if<50cc/24h. Patient examined and I agree with the assessment and plan  Violeta GelinasBurke Jennessa Trigo, MD, MPH, FACS Trauma: [phone] General Surgery: [phone]  03/10/2014 4:06 PM
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
## 2014-03-11 ENCOUNTER — Inpatient Hospital Stay (HOSPITAL_COMMUNITY): Payer: No Typology Code available for payment source

## 2014-03-11 DIAGNOSIS — Z9081 Acquired absence of spleen: Secondary | ICD-10-CM

## 2014-03-11 DIAGNOSIS — R509 Fever, unspecified: Secondary | ICD-10-CM

## 2014-03-11 LAB — CBC WITH DIFFERENTIAL/PLATELET
BASOS ABS: 0 10*3/uL (ref 0.0–0.1)
BASOS PCT: 0 % (ref 0–1)
EOS PCT: 1 % (ref 0–5)
Eosinophils Absolute: 0.1 10*3/uL (ref 0.0–0.7)
HEMATOCRIT: 29.9 % — AB (ref 39.0–52.0)
Hemoglobin: 10.8 g/dL — ABNORMAL LOW (ref 13.0–17.0)
Lymphocytes Relative: 6 % — ABNORMAL LOW (ref 12–46)
Lymphs Abs: 0.9 10*3/uL (ref 0.7–4.0)
MCH: 32.7 pg (ref 26.0–34.0)
MCHC: 36.1 g/dL — AB (ref 30.0–36.0)
MCV: 90.6 fL (ref 78.0–100.0)
MONO ABS: 2.9 10*3/uL — AB (ref 0.1–1.0)
MONOS PCT: 20 % — AB (ref 3–12)
Neutro Abs: 10.8 10*3/uL — ABNORMAL HIGH (ref 1.7–7.7)
Neutrophils Relative %: 74 % (ref 43–77)
Platelets: 240 10*3/uL (ref 150–400)
RBC: 3.3 MIL/uL — ABNORMAL LOW (ref 4.22–5.81)
RDW: 12.9 % (ref 11.5–15.5)
WBC: 14.6 10*3/uL — ABNORMAL HIGH (ref 4.0–10.5)

## 2014-03-11 LAB — URINALYSIS, ROUTINE W REFLEX MICROSCOPIC
Bilirubin Urine: NEGATIVE
GLUCOSE, UA: NEGATIVE mg/dL
Hgb urine dipstick: NEGATIVE
Ketones, ur: NEGATIVE mg/dL
LEUKOCYTES UA: NEGATIVE
Nitrite: NEGATIVE
PROTEIN: NEGATIVE mg/dL
SPECIFIC GRAVITY, URINE: 1.016 (ref 1.005–1.030)
UROBILINOGEN UA: 1 mg/dL (ref 0.0–1.0)
pH: 7.5 (ref 5.0–8.0)

## 2014-03-11 MED ORDER — OXYCODONE HCL 5 MG PO TABS: 5.0000 mg | ORAL_TABLET | ORAL | Status: DC | PRN

## 2014-03-11 MED ORDER — POLYETHYLENE GLYCOL 3350 17 G PO PACK: 17.0000 g | PACK | Freq: Every day | ORAL | Status: DC

## 2014-03-11 MED ORDER — DSS 100 MG PO CAPS: 100.0000 mg | ORAL_CAPSULE | Freq: Two times a day (BID) | ORAL | Status: DC

## 2014-03-11 NOTE — Progress Notes (Signed)
Spoke with pt and mother in room about DME needs for discharge home. PT is recommending a rolling walker. Pt is uninsured so it would cost him approximately $50.  Mom said she would make some calls to see if there is one that can be borrowed from a family member to avoid having to purchase one. She will let a staff member know before d/c if she is able to get one.
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
## 2014-03-11 NOTE — Progress Notes (Signed)
Need to do fever workup. Too early to check CT abdomen and pelvis. May be a reaction to immunizations. Patient examined and I agree with the assessment and plan  Violeta GelinasBurke Braydan Marriott, MD, MPH, FACS Trauma: [phone] General Surgery: [phone]  03/11/2014 3:30 PM
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
## 2014-03-12 DIAGNOSIS — R5083 Postvaccination fever: Secondary | ICD-10-CM | POA: Diagnosis not present

## 2014-03-12 LAB — CBC
HCT: 29.6 % — ABNORMAL LOW (ref 39.0–52.0)
Hemoglobin: 10.6 g/dL — ABNORMAL LOW (ref 13.0–17.0)
MCH: 32.4 pg (ref 26.0–34.0)
MCHC: 35.8 g/dL (ref 30.0–36.0)
MCV: 90.5 fL (ref 78.0–100.0)
PLATELETS: 294 10*3/uL (ref 150–400)
RBC: 3.27 MIL/uL — AB (ref 4.22–5.81)
RDW: 12.8 % (ref 11.5–15.5)
WBC: 13.8 10*3/uL — AB (ref 4.0–10.5)

## 2014-03-12 NOTE — Discharge Instructions (Signed)
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  Splenectomy, Long-Term Care After A splenectomy is the surgical removal of a diseased or injured spleen. The most common reasons for the spleen to be removed are because of severe injury (trauma), sickle cell disease, or a condition that causes blood clotting problems (idiopathic thrombocytopenic purpura, ITP). The spleen is an organ located in the upper abdomen under your left ribs. It is a sponge-like organ, about the size of an orange, which acts as a filter. The spleen removes waste from the blood, maintains cells that make antibodies to help fight infection, and stores other blood cells. The spleen, along with other body systems and organs, plays an important role in the body's natural defense system (immune system). Once it is removed, there is a slightly greater chance of developing a serious, life-threatening infection (overwhelming postsplenectomy sepsis). It is important to take extra steps to prevent infection. Other precautions may be necessary to prevent blood clots from forming. PREVENTING INFECTION Your caregiver will recommend important steps to help prevent infection. These may include:  Making sure your immunizations are up to date, including:  Pneumococcus.  Seasonal flu (influenza).  Haemophilus influenzae type b (Hib).  Meningitis.  Making sure family members' vaccines are up to date.  In some cases, antibiotics may be needed if you get symptoms of infection. Not all patients need this type of treatment after a splenectomy. Talk to your caregiver about what is best for you if these symptoms develop.  Following good daily practices to prevent infection, such as:  Washing hands often, especially after preparing food, eating, changing diapers, and playing with children or animals.  Disinfecting surfaces regularly.  Avoiding others with active illness or  infections.  Taking precautions to avoid mosquito and tick bites, such as:  Wearing proper clothing that covers the entire body when you are in wooded or marshy areas.  Changing clothing right away and checking for bites after you have been outside.  Using insect spray.  Using insect netting.  Staying indoors during peak mosquito hours.  Taking precautions to avoid dog bites.  You may be at increased risk for rare infections associated with dog bites after splenectomy. PREVENTING BLOOD CLOTS Splenectomy may increase your risk of forming a blood clot. This is of utmost concern immediately after surgery. However, it may continue as a lifelong risk. To help prevent blood clots, your caregiver may recommend:  Exercising as directed. Find a safe, regular exercise program that works well for you.  Getting up, stretching, and moving around every hour if you sit a lot or do not move about much at work or during travel time.  Taking medicines (such as aspirin) as directed. TRAVEL If you travel in the [place] take care to avoid tick bites, especially in the [place] areas. Ticks can spread the parasite that causes babesiosis. Babesiosis is a flu-like illness that is treatable. If you travel abroad where malaria is common, follow these guidelines:  Contact your caregiver and discuss the places you are visiting for specific advice.  Make sure all of your immunizations are up to date.  Get specific immunizations to guard against the disease risk in the country you are visiting.  Understand how to prevent infections, such as malaria, abroad. These infections can pose serious risk. Precautions may include:  Daily tablets to prevent malaria.  Using mosquito nets.  Using insect spray.  Bring your broad-spectrum, full-strength antibiotics with you.  HOME CARE INSTRUCTIONS   Take all medicines as directed. If you are prescribed antibiotics, discuss with your caregiver the use of a  probiotic supplement to prevent stomach upset.  Keep track of medicine refills so that you do not run out of medicine.  Inform your close contacts of your condition. Consider wearing a medical alert bracelet or carrying an ID card.  See your caregiver for vaccinations, follow-up exams, and testing as directed.  Follow all your caregiver's instructions on managing this and other conditions. SEEK MEDICAL CARE IF:   You have a fever.  You develop signs of infection, such as chills and feeling unwell, that continue after taking the full-strength antibiotic.  You are considering travel abroad.  You are bitten by a tick or a dog.  You have questions or concerns. SEEK IMMEDIATE MEDICAL CARE IF:   You have chest pain along with:  Shortness of breath.  Pain in the back, neck, or jaw.  You have pain or swelling in the leg.  You develop a sudden headache and dizziness. MAKE SURE YOU:   Understand these instructions.  Will watch your condition.  Will get help right away if you are not doing well or get worse. Document Released: 06/05/2010 Document Revised: 08/18/2013 Document Reviewed: 06/05/2010 [facility] Patient Information 2014 [place], [place].  [facility]      [facility], [place] [phone]  OPEN ABDOMINAL SURGERY: POST OP INSTRUCTIONS  Always review your discharge instruction sheet given to you by the facility where your surgery was performed.  IF YOU HAVE DISABILITY OR FAMILY LEAVE FORMS, YOU MUST BRING THEM TO THE OFFICE FOR PROCESSING.  PLEASE DO NOT GIVE THEM TO YOUR DOCTOR.  1. A prescription for pain medication may be given to you upon discharge.  Take your pain medication as prescribed, if needed.  If narcotic pain medicine is not needed, then you may take acetaminophen (Tylenol) or ibuprofen (Advil) as needed. 2. Take your usually prescribed medications unless otherwise directed. 3. If you need a refill on your pain medication, please contact your pharmacy.  They will contact our office to request authorization.  Prescriptions will not be filled after 5pm or on week-ends. 4. You should follow a light diet the first few days after arrival home, such as soup and crackers, pudding, etc.unless your doctor has advised otherwise. A high-fiber, low fat diet can be resumed as tolerated.   Be sure to include lots of fluids daily. Most patients will experience some swelling and bruising on the chest and neck area.  Ice packs will help.  Swelling and bruising can take several days to resolve 5. Most patients will experience some swelling and bruising in the area of the incision. Ice pack will help. Swelling and bruising can take several days to resolve..  6. It is common to experience some constipation if taking pain medication after surgery.  Increasing fluid intake and taking a stool softener will usually help or prevent this problem from occurring.  A mild laxative (Milk of Magnesia or Miralax) should be taken according to package directions if there are no bowel movements after 48 hours. 7.  You may have steri-strips (small skin tapes) in place directly over the incision.  These strips should be left on the skin for 7-10 days.  If your surgeon used skin glue on the incision, you may shower in 24 hours.  The glue will flake off over the next 2-3 weeks.  Any sutures or staples will be removed at the office during  your follow-up visit. You may find that a light gauze bandage over your incision may keep your staples from being rubbed or pulled. You may shower and replace the bandage daily. 8. ACTIVITIES:  You may resume regular (light) daily activities beginning the next day--such as daily self-care, walking, climbing stairs--gradually increasing activities as tolerated.  You may have sexual intercourse when it is comfortable.  Refrain from any heavy lifting or straining until approved by your doctor. a. You may drive when you no longer are taking prescription pain  medication, you can comfortably wear a seatbelt, and you can safely maneuver your car and apply brakes b. Return to Work: ___________________________________ 9. You should see your doctor in the office for a follow-up appointment approximately two weeks after your surgery.  Make sure that you call for this appointment within a day or two after you arrive home to insure a convenient appointment time. OTHER INSTRUCTIONS:  _____________________________________________________________ _____________________________________________________________  WHEN TO CALL YOUR DOCTOR: 1. Fever over 101.0 2. Inability to urinate 3. Nausea and/or vomiting 4. Extreme swelling or bruising 5. Continued bleeding from incision. 6. Increased pain, redness, or drainage from the incision. 7. Difficulty swallowing or breathing 8. Muscle cramping or spasms. 9. Numbness or tingling in hands or feet or around lips.  The clinic staff is available to answer your questions during regular business hours.  Please dont hesitate to call and ask to speak to one of the nurses if you have concerns.  For further questions, please visit [email or web address]  Bulb Drain Home Care A bulb drain consists of a thin rubber tube and a soft, round bulb that creates a gentle suction. The rubber tube is placed in the area where you had surgery. A bulb is attached to the end of the tube that is outside the body. The bulb drain removes excess fluid that normally builds up in a surgical wound after surgery. The color and amount of fluid will vary. Immediately after surgery, the fluid is bright red and is a little thicker than water. It may gradually change to a yellow or pink color and become more thin and water-like. When the amount decreases to about 1 or 2 tbsp in 24 hours, your health care provider will usually remove it. DAILY CARE  Keep the bulb flat (compressed) at all times, except while emptying it. The flatness creates  suction. You can flatten the bulb by squeezing it firmly in the middle and then closing the cap.  Keep sites where the tube enters the skin dry and covered with a bandage (dressing).  Secure the tube 1 2 in (2.5 5.1 cm) below the insertion sites, to keep it from pulling on your stitches. The tube is stitched in place and will not slip out.  Secure the bulb as directed by your health care provider.  For the first 3 days after surgery, there usually is more fluid in the bulb. Empty the bulb whenever it becomes half full because the bulb does not create enough suction if it is too full. The bulb could also overflow. Write down how much fluid you remove each time you empty your drain. Add up the amount removed in 24 hours.  Empty the bulb at the same time every day once the amount of fluid decreases and you only need to empty it once a day. Write down the amounts and the 24-hour totals to give to your health care provider. This helps your health care provider know when the tubes can be removed.  EMPTYING THE BULB DRAIN Before emptying the bulb, get a measuring cup, a piece of paper, and a pen and wash your hands.  Gently run your fingers down the tube (stripping) to empty any drainage from the tubing into the bulb. This may need to be done several times a day to clear the tubing of clots and tissue.  Open the bulb cap to release suction, which causes it to inflate. Do not touch the inside of the cap.  Gently run your fingers down the tube (stripping) to empty any drainage from the tubing into the bulb.  Hold the cap out of the way, and pour fluid into the measuring cup.   Squeeze the bulb to provide suction.  Replace the cap.   Check the tape that holds the tube to your skin. If it is becoming loose, you can remove the loose piece of tape and apply a new one. Then, pin the bulb to your shirt.   Write down the amount of fluid you emptied out. Write down the date and each time you emptied  your bulb drain. (If there are 2 bulbs, note the amount of drainage from each bulb and keep the totals separate. Your health care provider will want to know the total amounts for each drain and which tube is draining more.)   Flush the fluid down the toilet and wash your hands.   Call your health care provider once you have less than 2 tbsp of fluid collecting in the bulb drain every 24 hours. If there is drainage around the tube site, change dressings and keep the area dry. Cleanse around tube with sterile saline and place dry gauze around site. This gauze should be changed when it is soiled. If it stays clean and unsoiled, it should still be changed daily.  SEEK MEDICAL CARE IF:  Your drainage has a bad smell or is cloudy.   You have a fever.   Your drainage is increasing instead of decreasing.   Your tube fell out.   You have redness or swelling around the tube site.   You have drainage from a surgical wound.   Your bulb drain will not stay flat after you empty it.  MAKE SURE YOU:   Understand these instructions.  Will watch your condition.  Will get help right away if you are not doing well or get worse. Document Released: 12/13/2000 Document Revised: 10/06/2013 Document Reviewed: 05/20/2012 [facility] Patient Information 2014 [place], [place].

## 2014-03-12 NOTE — Progress Notes (Signed)
Patient discharged to home with instructions given to patient and grandmother, verbalized understanding

## 2014-03-13 LAB — URINE CULTURE
Colony Count: NO GROWTH
Culture: NO GROWTH

## 2014-03-17 LAB — CULTURE, BLOOD (ROUTINE X 2)
CULTURE: NO GROWTH
Culture: NO GROWTH

## 2014-03-18 ENCOUNTER — Emergency Department (HOSPITAL_COMMUNITY): Payer: No Typology Code available for payment source

## 2014-03-18 ENCOUNTER — Emergency Department (HOSPITAL_COMMUNITY)
Admission: EM | Admit: 2014-03-18 | Discharge: 2014-03-18 | Disposition: A | Discharge status: Home / Self Care | Payer: No Typology Code available for payment source | Attending: Emergency Medicine | Admitting: Emergency Medicine

## 2014-03-18 ENCOUNTER — Encounter (HOSPITAL_COMMUNITY): Payer: Self-pay | Admitting: Emergency Medicine

## 2014-03-18 DIAGNOSIS — Z79899 Other long term (current) drug therapy: Secondary | ICD-10-CM | POA: Insufficient documentation

## 2014-03-18 DIAGNOSIS — Z87828 Personal history of other (healed) physical injury and trauma: Secondary | ICD-10-CM | POA: Insufficient documentation

## 2014-03-18 DIAGNOSIS — F17200 Nicotine dependence, unspecified, uncomplicated: Secondary | ICD-10-CM | POA: Insufficient documentation

## 2014-03-18 DIAGNOSIS — G8918 Other acute postprocedural pain: Secondary | ICD-10-CM | POA: Insufficient documentation

## 2014-03-18 HISTORY — DX: Other specified postprocedural states: Z98.890

## 2014-03-18 HISTORY — DX: Accidental discharge from unspecified firearms or gun, initial encounter: W34.00XA

## 2014-03-18 LAB — CBC WITH DIFFERENTIAL/PLATELET
BASOS ABS: 0.1 10*3/uL (ref 0.0–0.1)
BASOS PCT: 1 % (ref 0–1)
EOS PCT: 3 % (ref 0–5)
Eosinophils Absolute: 0.4 10*3/uL (ref 0.0–0.7)
HCT: 30.8 % — ABNORMAL LOW (ref 39.0–52.0)
Hemoglobin: 10.9 g/dL — ABNORMAL LOW (ref 13.0–17.0)
Lymphocytes Relative: 16 % (ref 12–46)
Lymphs Abs: 2 10*3/uL (ref 0.7–4.0)
MCH: 32.6 pg (ref 26.0–34.0)
MCHC: 35.4 g/dL (ref 30.0–36.0)
MCV: 92.2 fL (ref 78.0–100.0)
Monocytes Absolute: 0.9 10*3/uL (ref 0.1–1.0)
Monocytes Relative: 7 % (ref 3–12)
Neutro Abs: 8.7 10*3/uL — ABNORMAL HIGH (ref 1.7–7.7)
Neutrophils Relative %: 73 % (ref 43–77)
PLATELETS: 739 10*3/uL — AB (ref 150–400)
RBC: 3.34 MIL/uL — ABNORMAL LOW (ref 4.22–5.81)
RDW: 13 % (ref 11.5–15.5)
WBC: 12 10*3/uL — ABNORMAL HIGH (ref 4.0–10.5)

## 2014-03-18 LAB — COMPREHENSIVE METABOLIC PANEL
ALBUMIN: 3.1 g/dL — AB (ref 3.5–5.2)
ALT: 11 U/L (ref 0–53)
AST: 16 U/L (ref 0–37)
Alkaline Phosphatase: 57 U/L (ref 39–117)
BUN: 11 mg/dL (ref 6–23)
CALCIUM: 9.1 mg/dL (ref 8.4–10.5)
CO2: 27 mEq/L (ref 19–32)
Chloride: 98 mEq/L (ref 96–112)
Creatinine, Ser: 0.65 mg/dL (ref 0.50–1.35)
GFR calc non Af Amer: >90 mL/min (ref >90)
Glucose, Bld: 88 mg/dL (ref 70–99)
Potassium: 4.3 mEq/L (ref 3.7–5.3)
Sodium: 136 mEq/L — ABNORMAL LOW (ref 137–147)
TOTAL PROTEIN: 7 g/dL (ref 6.0–8.3)
Total Bilirubin: 0.2 mg/dL — ABNORMAL LOW (ref 0.3–1.2)

## 2014-03-18 MED ORDER — OXYCODONE HCL 5 MG PO TABS
10.0000 mg | ORAL_TABLET | Freq: Once | ORAL | Status: AC
  Administered 2014-03-18: 10 mg via ORAL
  Filled 2014-03-18: qty 2

## 2014-03-18 MED ORDER — OXYCODONE HCL 5 MG PO TABS: 5.0000 mg | ORAL_TABLET | ORAL | Status: DC | PRN

## 2014-03-18 NOTE — Discharge Instructions (Signed)
Please follow up with your surgery clinic as scheduled on Wednesday.  Followup sooner for worsening pain, fever, vomiting, or other new concerning symptoms.   Pain Relief Preoperatively and Postoperatively Being a good patient does not mean being a silent one.If you have questions, problems, or concerns about the pain you may feel after surgery, let your caregiver know.Patients have the right to assessment and management of pain. The treatment of pain after surgery is important to speed up recovery and return to normal activities. Severe pain after surgery, and the fear or anxiety associated with that pain, may cause extreme discomfort that:  Prevents sleep.  Decreases the ability to breathe deeply and cough. This can cause pneumonia or other upper airway infections.  Causes your heart to beat faster and your blood pressure to be higher.  Increases the risk for constipation and bloating.  Decreases the ability of wounds to heal.  May result in depression, increased anxiety, and feelings of helplessness. Relief of pain before surgery is also important because it will lessen the pain after surgery. Patients who receive both pain relief before and after surgery experience greater pain relief than those who only receive pain relief after surgery. Let your caregiver know if you are having uncontrolled pain.This is very important.Pain after surgery is more difficult to manage if it is permitted to become severe, so prompt and adequate treatment of acute pain is necessary. PAIN CONTROL METHODS Your caregivers follow policies and procedures about the management of patient pain.These guidelines should be explained to you before surgery.Plans for pain control after surgery must be mutually decided upon and instituted with your full understanding and agreement.Do not be afraid to ask questions regarding the care you are receiving.There are many different ways your caregivers will attempt to control  your pain, including the following methods. As needed pain control  You may be given pain medicine either through your intravenous (IV) tube, or as a pill or liquid you can swallow. You will need to let your caregiver know when you are having pain. Then, your caregiver will give you the pain medicine ordered for you.  Your pain medicine may make you constipated. If constipation occurs, drink more liquids if you can. Your caregiver may have you take a mild laxative. IV patient-controlled analgesia pump (PCA pump)  You can get your pain medicine through the IV tube which goes into your vein. You are able to control the amount of pain medicine that you get. The pain medicine flows in through an IV tube and is controlled by a pump. This pump gives you a set amount of pain medicine when you push the button hooked up to it. Nobody should push this button but you or someone specifically assigned by you to do so. It is set up to keep you from accidentally giving yourself too much pain medicine. You will be able to start using your pain pump in the recovery room after your surgery. This method can be helpful for most types of surgery.  If you are still having too much pain, tell your caregiver. Also, tell your caregiver if you are feeling too sleepy or nauseous. Continuous epidural pain control  A thin, soft tube (catheter) is put into your back. Pain medicine flows through the catheter to lessen pain in the part of your body where the surgery is done. Continuous epidural pain control may work best for you if you are having surgery on your chest, abdomen, hip area, or legs. The epidural catheter  is usually put into your back just before surgery. The catheter is left in until you can eat and take medicine by mouth. In most cases, this may take 2 to 3 days.  Giving pain medicine through the epidural catheter may help you heal faster because:  Your bowel gets back to normal faster.  You can get back to eating  sooner.  You can be up and walking sooner. Medicine that numbs the area (local anesthetic)  You may receive an injection of pain medicine near where the pain is (local infiltration).  You may receive an injection of pain medicine near the nerve that controls the sensation to a specific part of the body (peripheral nerve block).  Medicine may be put in the spine to block pain (spinal block). Opioids  Moderate to moderately severe acute pain after surgery may respond to opioids.Opioids are narcotic pain medicine. Opioids are often combined with non-narcotic medicines to improve pain relief, diminish the risk of side effects, and reduce the chance of addiction.  If you follow your caregiver's directions about taking opioids and you do not have a history of substance abuse, your risk of becoming addicted is exceptionally small.Opioids are given for short periods of time in careful doses to prevent addiction. Other methods of pain control include:  Steroids.  Physical therapy.  Heat and cold therapy.  Compression, such as wrapping an elastic bandage around the area of pain.  Massage. These various ways of controlling pain may be used together. Combining different methods of pain control is called multimodal analgesia. Using this approach has many benefits, including being able to eat, move around, and leave the hospital sooner. Document Released: 03/08/2003 Document Revised: 03/09/2012 Document Reviewed: 03/12/2011 [facility] Patient Information 2014 [place], [place].
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
## 2014-03-18 NOTE — ED Notes (Signed)
Pt states he has shooting pain at the bottom of his stomach.  Pt states he has one pain pill left and needs a refill.  Pt has a follow up visit on Wednesday to remove staples and Al PimpleJackson Pratt drainage bag.

## 2014-03-23 ENCOUNTER — Ambulatory Visit (INDEPENDENT_AMBULATORY_CARE_PROVIDER_SITE_OTHER): Payer: No Typology Code available for payment source | Admitting: General Surgery

## 2014-03-23 ENCOUNTER — Encounter (INDEPENDENT_AMBULATORY_CARE_PROVIDER_SITE_OTHER): Payer: Self-pay

## 2014-03-23 VITALS — BP 118/80 | HR 78 | Temp 98.5°F | Resp 16 | Ht 76.0 in | Wt 144.0 lb

## 2014-03-23 DIAGNOSIS — Z9889 Other specified postprocedural states: Secondary | ICD-10-CM
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
## 2014-03-23 NOTE — Patient Instructions (Signed)
20 lb weight restriction x3 weeks, then resume all activities.  Apply bandaid to drain site..  Follow up as needed.

## 2014-10-16 ENCOUNTER — Emergency Department (HOSPITAL_COMMUNITY)
Admission: EM | Admit: 2014-10-16 | Discharge: 2014-10-17 | Disposition: A | Discharge status: Home / Self Care | Payer: No Typology Code available for payment source | Attending: Emergency Medicine | Admitting: Emergency Medicine

## 2014-10-16 ENCOUNTER — Encounter (HOSPITAL_COMMUNITY): Payer: Self-pay | Admitting: Emergency Medicine

## 2014-10-16 DIAGNOSIS — Z9889 Other specified postprocedural states: Secondary | ICD-10-CM | POA: Insufficient documentation

## 2014-10-16 DIAGNOSIS — Z720 Tobacco use: Secondary | ICD-10-CM | POA: Insufficient documentation

## 2014-10-16 DIAGNOSIS — Z202 Contact with and (suspected) exposure to infections with a predominantly sexual mode of transmission: Secondary | ICD-10-CM | POA: Insufficient documentation

## 2014-10-16 DIAGNOSIS — Z79899 Other long term (current) drug therapy: Secondary | ICD-10-CM | POA: Insufficient documentation

## 2014-10-16 DIAGNOSIS — Z87828 Personal history of other (healed) physical injury and trauma: Secondary | ICD-10-CM | POA: Insufficient documentation

## 2014-10-16 DIAGNOSIS — Z711 Person with feared health complaint in whom no diagnosis is made: Secondary | ICD-10-CM

## 2014-10-16 DIAGNOSIS — R369 Urethral discharge, unspecified: Secondary | ICD-10-CM | POA: Insufficient documentation

## 2014-10-16 NOTE — ED Notes (Signed)
C/o pain with urination and yellowish penile discharge x 2 days.

## 2014-10-17 MED ORDER — AZITHROMYCIN 250 MG PO TABS
1000.0000 mg | ORAL_TABLET | Freq: Once | ORAL | Status: AC
  Administered 2014-10-17: 1000 mg via ORAL
  Filled 2014-10-17: qty 4

## 2014-10-17 MED ORDER — CEFTRIAXONE SODIUM 250 MG IJ SOLR
250.0000 mg | Freq: Once | INTRAMUSCULAR | Status: AC
  Administered 2014-10-17: 250 mg via INTRAMUSCULAR
  Filled 2014-10-17: qty 250

## 2014-10-17 MED ORDER — STERILE WATER FOR INJECTION IJ SOLN
INTRAMUSCULAR | Status: AC
  Administered 2014-10-17: 2.1 mL
  Filled 2014-10-17: qty 10

## 2014-10-17 NOTE — ED Provider Notes (Signed)
Medical screening examination/treatment/procedure(s) were performed by non-physician practitioner and as supervising physician I was immediately available for consultation/collaboration.   EKG Interpretation None       Flint MelterElliott L Gianni Fuchs, MD 10/17/14 1131
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
## 2014-10-17 NOTE — Discharge Instructions (Signed)
°  Refrain from sexual intercourse for 7 days. Be sure to have all partners tested and treated for STDs.  Practice safe sex by always wearing condoms.  ° °

## 2014-10-17 NOTE — ED Notes (Signed)
PA at bedside.

## 2014-10-19 LAB — GC/CHLAMYDIA PROBE AMP
CT Probe RNA: NEGATIVE
GC Probe RNA: POSITIVE — AB

## 2014-10-20 ENCOUNTER — Telehealth (HOSPITAL_COMMUNITY): Payer: Self-pay

## 2014-11-12 IMAGING — DX DG CHEST 1V PORT
1 series · 1 of 1 positions shown · non-contrast
Comparison: none

[portable]
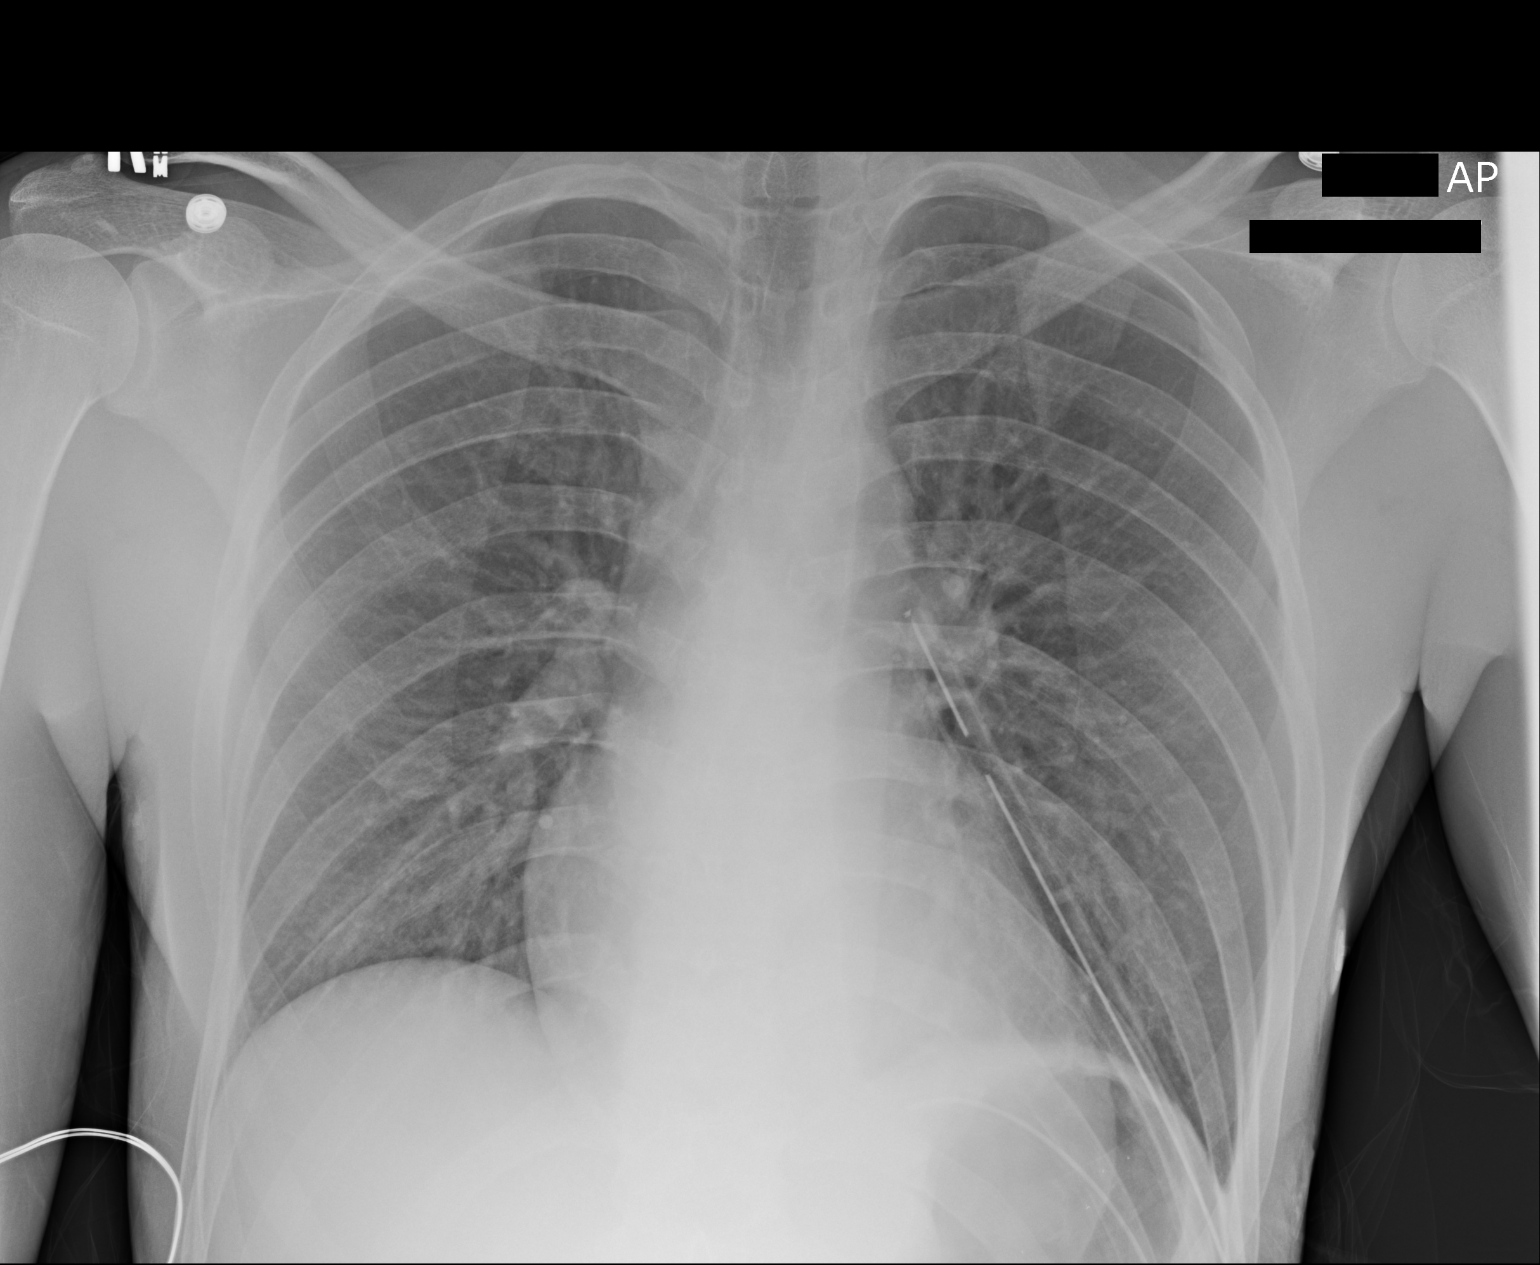

[1 of 1 positions shown; findings below may reference images not displayed]

CLINICAL DATA
Tube position

EXAM
PORTABLE CHEST - 1 VIEW

COMPARISON
03/09/2014

FINDINGS
Left chest tube remains in place. Tiny left apical pneumothorax
unchanged.

Slight increase in bibasilar atelectasis. No significant effusion.
Negative for heart failure.

IMPRESSION
Tiny left apical pneumothorax unchanged

Slight increase in bibasilar atelectasis

SIGNATURE
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
## 2015-05-01 ENCOUNTER — Encounter (HOSPITAL_COMMUNITY): Payer: Self-pay | Admitting: Emergency Medicine

## 2015-05-01 ENCOUNTER — Emergency Department (HOSPITAL_COMMUNITY)
Admission: EM | Admit: 2015-05-01 | Discharge: 2015-05-02 | Disposition: A | Discharge status: Home / Self Care | Payer: No Typology Code available for payment source | Attending: Emergency Medicine | Admitting: Emergency Medicine

## 2015-05-01 DIAGNOSIS — Z87828 Personal history of other (healed) physical injury and trauma: Secondary | ICD-10-CM | POA: Insufficient documentation

## 2015-05-01 DIAGNOSIS — Z9889 Other specified postprocedural states: Secondary | ICD-10-CM | POA: Insufficient documentation

## 2015-05-01 DIAGNOSIS — Z720 Tobacco use: Secondary | ICD-10-CM | POA: Insufficient documentation

## 2015-05-01 DIAGNOSIS — N342 Other urethritis: Secondary | ICD-10-CM

## 2015-05-01 DIAGNOSIS — N341 Nonspecific urethritis: Secondary | ICD-10-CM | POA: Insufficient documentation

## 2015-05-01 DIAGNOSIS — Z79899 Other long term (current) drug therapy: Secondary | ICD-10-CM | POA: Insufficient documentation

## 2015-05-01 MED ORDER — AZITHROMYCIN 250 MG PO TABS
1000.0000 mg | ORAL_TABLET | Freq: Once | ORAL | Status: AC
  Administered 2015-05-02: 1000 mg via ORAL
  Filled 2015-05-01: qty 4

## 2015-05-01 MED ORDER — CEFTRIAXONE SODIUM 250 MG IJ SOLR
250.0000 mg | Freq: Once | INTRAMUSCULAR | Status: AC
  Administered 2015-05-02: 250 mg via INTRAMUSCULAR
  Filled 2015-05-01: qty 250

## 2015-05-01 NOTE — ED Notes (Signed)
Pt. reports penile discharge onset last week requesting STD screening .

## 2015-05-01 NOTE — ED Notes (Signed)
Josh, pa-c, at the bedside.  
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
## 2015-05-02 LAB — GC/CHLAMYDIA PROBE AMP (~~LOC~~) NOT AT ARMC
CHLAMYDIA, DNA PROBE: NEGATIVE
NEISSERIA GONORRHEA: POSITIVE — AB

## 2015-05-02 LAB — HIV ANTIBODY (ROUTINE TESTING W REFLEX): HIV SCREEN 4TH GENERATION: NONREACTIVE

## 2015-05-02 LAB — RPR: RPR Ser Ql: NONREACTIVE

## 2015-05-02 NOTE — Discharge Instructions (Signed)
Please read and follow all provided instructions.  Your diagnoses today include:  1. Urethritis    Tests performed today include:  Test for gonorrhea and chlamydia. You will be notified by telephone if you have a positive result.  Test for syphilis and HIV.  Vital signs. See below for your results today.   Medications:  You were treated for chlamydia (1 gram azithromycin pills) and gonorrhea (250mg  rocephin shot).  Home care instructions:  Read educational materials contained in this packet and follow any instructions provided.   You should tell your partners about your infection and avoid having sex for one week to allow time for the medicine to work.  STD Testing:  [facility] [place] [place] [facility]  [address], [place], phone [phone] or [phone]    Monday - Friday, call for an appointment  [facility] [place] [place] [facility]  [address], [place], phone [phone] or [phone]   Monday - Friday, call for an appointment  Return instructions:   Please return to the Emergency Department if you experience worsening symptoms.   Please return if you have any other emergent concerns.  Additional Information:  Your vital signs today were: BP 126/72 mmHg   Pulse 86   Temp(Src) 98.6 F (37 C) (Oral)   Resp 14   Ht 6\' 4"  (1.93 m)   Wt 163 lb (73.936 kg)   BMI 19.85 kg/m2   SpO2 98% If your blood pressure (BP) was elevated above 135/85 this visit, please have this repeated by your doctor within one month. --------------

## 2015-05-03 ENCOUNTER — Telehealth (HOSPITAL_BASED_OUTPATIENT_CLINIC_OR_DEPARTMENT_OTHER): Payer: Self-pay | Admitting: Emergency Medicine

## 2015-12-22 ENCOUNTER — Encounter (HOSPITAL_BASED_OUTPATIENT_CLINIC_OR_DEPARTMENT_OTHER): Payer: Self-pay | Admitting: *Deleted

## 2015-12-22 ENCOUNTER — Emergency Department (HOSPITAL_BASED_OUTPATIENT_CLINIC_OR_DEPARTMENT_OTHER): Payer: No Typology Code available for payment source

## 2015-12-22 ENCOUNTER — Emergency Department (HOSPITAL_BASED_OUTPATIENT_CLINIC_OR_DEPARTMENT_OTHER)
Admission: EM | Admit: 2015-12-22 | Discharge: 2015-12-22 | Disposition: A | Discharge status: Home / Self Care | Payer: No Typology Code available for payment source | Attending: Emergency Medicine | Admitting: Emergency Medicine

## 2015-12-22 DIAGNOSIS — R1033 Periumbilical pain: Secondary | ICD-10-CM | POA: Insufficient documentation

## 2015-12-22 DIAGNOSIS — Z87828 Personal history of other (healed) physical injury and trauma: Secondary | ICD-10-CM | POA: Insufficient documentation

## 2015-12-22 DIAGNOSIS — F17210 Nicotine dependence, cigarettes, uncomplicated: Secondary | ICD-10-CM | POA: Insufficient documentation

## 2015-12-22 DIAGNOSIS — Z9889 Other specified postprocedural states: Secondary | ICD-10-CM | POA: Insufficient documentation

## 2015-12-22 DIAGNOSIS — R748 Abnormal levels of other serum enzymes: Secondary | ICD-10-CM | POA: Insufficient documentation

## 2015-12-22 LAB — URINALYSIS, ROUTINE W REFLEX MICROSCOPIC
GLUCOSE, UA: NEGATIVE mg/dL
Hgb urine dipstick: NEGATIVE
KETONES UR: 15 mg/dL — AB
Leukocytes, UA: NEGATIVE
Nitrite: NEGATIVE
Protein, ur: NEGATIVE mg/dL
Specific Gravity, Urine: 1.031 — ABNORMAL HIGH (ref 1.005–1.030)
pH: 6 (ref 5.0–8.0)

## 2015-12-22 LAB — COMPREHENSIVE METABOLIC PANEL
ALT: 12 U/L — ABNORMAL LOW (ref 17–63)
ANION GAP: 7 (ref 5–15)
AST: 26 U/L (ref 15–41)
Albumin: 4.4 g/dL (ref 3.5–5.0)
Alkaline Phosphatase: 51 U/L (ref 38–126)
BUN: 11 mg/dL (ref 6–20)
CO2: 24 mmol/L (ref 22–32)
Calcium: 8.9 mg/dL (ref 8.9–10.3)
Chloride: 105 mmol/L (ref 101–111)
Creatinine, Ser: 0.82 mg/dL (ref 0.61–1.24)
GFR calc Af Amer: >60 mL/min (ref >60)
GFR calc non Af Amer: >60 mL/min (ref >60)
Glucose, Bld: 101 mg/dL — ABNORMAL HIGH (ref 65–99)
POTASSIUM: 3.6 mmol/L (ref 3.5–5.1)
Sodium: 136 mmol/L (ref 135–145)
Total Bilirubin: 1.8 mg/dL — ABNORMAL HIGH (ref 0.3–1.2)
Total Protein: 7.3 g/dL (ref 6.5–8.1)

## 2015-12-22 LAB — CBC
HEMATOCRIT: 39.5 % (ref 39.0–52.0)
Hemoglobin: 13.8 g/dL (ref 13.0–17.0)
MCH: 32.8 pg (ref 26.0–34.0)
MCHC: 34.9 g/dL (ref 30.0–36.0)
MCV: 93.8 fL (ref 78.0–100.0)
Platelets: 276 10*3/uL (ref 150–400)
RBC: 4.21 MIL/uL — ABNORMAL LOW (ref 4.22–5.81)
RDW: 12.5 % (ref 11.5–15.5)
WBC: 7.2 10*3/uL (ref 4.0–10.5)

## 2015-12-22 LAB — LIPASE, BLOOD: Lipase: 58 U/L — ABNORMAL HIGH (ref 11–51)

## 2015-12-22 MED ORDER — IOHEXOL 300 MG/ML  SOLN
100.0000 mL | Freq: Once | INTRAMUSCULAR | Status: AC | PRN
  Administered 2015-12-22: 100 mL via INTRAVENOUS

## 2015-12-22 MED ORDER — ONDANSETRON HCL 4 MG PO TABS: 4.0000 mg | ORAL_TABLET | Freq: Four times a day (QID) | ORAL | Status: DC

## 2015-12-22 MED ORDER — IOHEXOL 300 MG/ML  SOLN
50.0000 mL | Freq: Once | INTRAMUSCULAR | Status: AC | PRN
  Administered 2015-12-22: 50 mL via ORAL

## 2015-12-22 MED ORDER — OXYCODONE-ACETAMINOPHEN 5-325 MG PO TABS: 1.0000 | ORAL_TABLET | Freq: Four times a day (QID) | ORAL | Status: DC | PRN
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
## 2015-12-22 NOTE — ED Notes (Signed)
MD at bedside. 

## 2015-12-22 NOTE — ED Notes (Signed)
Pt c/o diffuse abd pain x 8 hrs

## 2015-12-22 NOTE — Discharge Instructions (Signed)
Return to the ED with any concerns including vomiting and not able to keep down liquids, worsening abdominal pain not controlled by medications, decreased level of alertness/lethargy, or any other alarming symptoms  You should drink only clear liquids for the next 24-48 hours as you may have mild pancreatitis  The CT scan shows the following:   Mild right hydronephrosis with mild fullness of the left renal collecting system. Although these findings may be chronic, correlation with urinalysis recommended to exclude UTI  There is no evidence of a UTI at this time, a urine culture is pending.  You should arrange to discuss these findings further with your primary care doctor

## 2017-05-01 ENCOUNTER — Encounter (HOSPITAL_COMMUNITY): Payer: Self-pay

## 2017-05-01 ENCOUNTER — Emergency Department (HOSPITAL_COMMUNITY)
Admission: EM | Admit: 2017-05-01 | Discharge: 2017-05-01 | Disposition: A | Discharge status: Home / Self Care | Payer: No Typology Code available for payment source | Attending: Emergency Medicine | Admitting: Emergency Medicine

## 2017-05-01 DIAGNOSIS — F17210 Nicotine dependence, cigarettes, uncomplicated: Secondary | ICD-10-CM | POA: Insufficient documentation

## 2017-05-01 DIAGNOSIS — Z711 Person with feared health complaint in whom no diagnosis is made: Secondary | ICD-10-CM

## 2017-05-01 DIAGNOSIS — R300 Dysuria: Secondary | ICD-10-CM

## 2017-05-01 DIAGNOSIS — Z202 Contact with and (suspected) exposure to infections with a predominantly sexual mode of transmission: Secondary | ICD-10-CM | POA: Insufficient documentation

## 2017-05-01 LAB — URINALYSIS, ROUTINE W REFLEX MICROSCOPIC
Bilirubin Urine: NEGATIVE
GLUCOSE, UA: NEGATIVE mg/dL
Hgb urine dipstick: NEGATIVE
Ketones, ur: NEGATIVE mg/dL
NITRITE: NEGATIVE
PROTEIN: NEGATIVE mg/dL
SPECIFIC GRAVITY, URINE: 1.023 (ref 1.005–1.030)
Squamous Epithelial / LPF: NONE SEEN
pH: 7 (ref 5.0–8.0)

## 2017-05-01 MED ORDER — LIDOCAINE HCL (PF) 1 % IJ SOLN
2.0000 mL | Freq: Once | INTRAMUSCULAR | Status: AC
  Administered 2017-05-01: 2 mL
  Filled 2017-05-01: qty 5

## 2017-05-01 MED ORDER — AZITHROMYCIN 250 MG PO TABS
1000.0000 mg | ORAL_TABLET | Freq: Once | ORAL | Status: AC
  Administered 2017-05-01: 1000 mg via ORAL
  Filled 2017-05-01: qty 4

## 2017-05-01 MED ORDER — CEFTRIAXONE SODIUM 250 MG IJ SOLR
250.0000 mg | Freq: Once | INTRAMUSCULAR | Status: AC
  Administered 2017-05-01: 250 mg via INTRAMUSCULAR
  Filled 2017-05-01: qty 250

## 2017-05-01 MED ORDER — METRONIDAZOLE 500 MG PO TABS
2000.0000 mg | ORAL_TABLET | Freq: Once | ORAL | Status: AC
  Administered 2017-05-01: 2000 mg via ORAL
  Filled 2017-05-01: qty 4

## 2017-05-01 NOTE — ED Triage Notes (Signed)
Pt reports dysuria "a tingling when I pee." that started yesterday. He denies any penile discharge and denies abdominal or back pain.
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
## 2017-05-01 NOTE — Discharge Instructions (Signed)
Antibiotics given or for treatment of possible STD. Return for any new or worse symptoms.

## 2017-09-28 ENCOUNTER — Emergency Department (HOSPITAL_BASED_OUTPATIENT_CLINIC_OR_DEPARTMENT_OTHER)
Admission: EM | Admit: 2017-09-28 | Discharge: 2017-09-28 | Disposition: A | Discharge status: Home / Self Care | Payer: No Typology Code available for payment source | Attending: Emergency Medicine | Admitting: Emergency Medicine

## 2017-09-28 ENCOUNTER — Encounter (HOSPITAL_BASED_OUTPATIENT_CLINIC_OR_DEPARTMENT_OTHER): Payer: Self-pay | Admitting: *Deleted

## 2017-09-28 DIAGNOSIS — J0100 Acute maxillary sinusitis, unspecified: Secondary | ICD-10-CM | POA: Insufficient documentation

## 2017-09-28 DIAGNOSIS — R6889 Other general symptoms and signs: Secondary | ICD-10-CM | POA: Insufficient documentation

## 2017-09-28 DIAGNOSIS — F17210 Nicotine dependence, cigarettes, uncomplicated: Secondary | ICD-10-CM | POA: Insufficient documentation

## 2017-09-28 LAB — RAPID STREP SCREEN (MED CTR MEBANE ONLY): Streptococcus, Group A Screen (Direct): NEGATIVE

## 2017-09-28 LAB — MONONUCLEOSIS SCREEN: Mono Screen: NEGATIVE

## 2017-09-28 MED ORDER — ACETAMINOPHEN 500 MG PO TABS
1000.0000 mg | ORAL_TABLET | Freq: Once | ORAL | Status: AC
  Administered 2017-09-28: 1000 mg via ORAL
  Filled 2017-09-28: qty 2

## 2017-09-28 MED ORDER — OXYMETAZOLINE HCL 0.05 % NA SOLN: 1.0000 | Freq: Two times a day (BID) | NASAL | 0 refills | Status: AC

## 2017-09-28 MED ORDER — FLUTICASONE PROPIONATE 50 MCG/ACT NA SUSP: 2.0000 | Freq: Every day | NASAL | 0 refills | Status: AC

## 2017-09-28 MED ORDER — AZITHROMYCIN 250 MG PO TABS: 250.0000 mg | ORAL_TABLET | Freq: Every day | ORAL | 0 refills | Status: DC

## 2017-09-28 MED ORDER — LORATADINE 10 MG PO TABS: 10.0000 mg | ORAL_TABLET | Freq: Every day | ORAL | 0 refills | Status: AC

## 2017-09-28 NOTE — ED Triage Notes (Signed)
Pt endorses intermittent left sided headache over the past week, also having body aches and chills. Pt also reports he was started on antibiotics by his dentist on Wednesday. Last ibuprofen about 0245
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
## 2017-09-28 NOTE — ED Notes (Signed)
MD with pt  

## 2017-09-30 LAB — CULTURE, GROUP A STREP (THRC)

## 2019-06-16 ENCOUNTER — Emergency Department (HOSPITAL_COMMUNITY)
Admission: EM | Admit: 2019-06-16 | Discharge: 2019-06-17 | Disposition: A | Discharge status: Home / Self Care | Payer: No Typology Code available for payment source | Attending: Emergency Medicine | Admitting: Emergency Medicine

## 2019-06-16 ENCOUNTER — Other Ambulatory Visit: Payer: Self-pay

## 2019-06-16 DIAGNOSIS — Z202 Contact with and (suspected) exposure to infections with a predominantly sexual mode of transmission: Secondary | ICD-10-CM

## 2019-06-16 DIAGNOSIS — F17210 Nicotine dependence, cigarettes, uncomplicated: Secondary | ICD-10-CM | POA: Insufficient documentation

## 2019-06-16 DIAGNOSIS — Z79899 Other long term (current) drug therapy: Secondary | ICD-10-CM | POA: Insufficient documentation

## 2019-06-16 MED ORDER — AZITHROMYCIN 250 MG PO TABS
1000.0000 mg | ORAL_TABLET | Freq: Once | ORAL | Status: AC
  Administered 2019-06-17: 1000 mg via ORAL
  Filled 2019-06-16: qty 4

## 2019-06-16 MED ORDER — CEFTRIAXONE SODIUM 250 MG IJ SOLR
250.0000 mg | Freq: Once | INTRAMUSCULAR | Status: AC
  Administered 2019-06-17: 250 mg via INTRAMUSCULAR
  Filled 2019-06-16: qty 250

## 2019-06-16 MED ORDER — LIDOCAINE HCL (PF) 1 % IJ SOLN
5.0000 mL | Freq: Once | INTRAMUSCULAR | Status: AC
  Administered 2019-06-17: 5 mL via INTRADERMAL
  Filled 2019-06-16: qty 5
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
## 2019-06-16 NOTE — Discharge Instructions (Addendum)

## 2019-06-17 LAB — URINALYSIS, ROUTINE W REFLEX MICROSCOPIC
Bilirubin Urine: NEGATIVE
Glucose, UA: NEGATIVE mg/dL
Hgb urine dipstick: NEGATIVE
Ketones, ur: 5 mg/dL — AB
Nitrite: NEGATIVE
Protein, ur: 100 mg/dL — AB
Specific Gravity, Urine: 1.027 (ref 1.005–1.030)
WBC, UA: >50 WBC/hpf — ABNORMAL HIGH (ref 0–5)
pH: 6 (ref 5.0–8.0)

## 2019-06-17 LAB — RPR: RPR Ser Ql: NONREACTIVE

## 2019-06-17 LAB — HIV ANTIBODY (ROUTINE TESTING W REFLEX): HIV Screen 4th Generation wRfx: NONREACTIVE

## 2019-06-18 LAB — GC/CHLAMYDIA PROBE AMP (~~LOC~~) NOT AT ARMC
Chlamydia: NEGATIVE
Neisseria Gonorrhea: POSITIVE — AB

## 2020-11-12 ENCOUNTER — Emergency Department (HOSPITAL_COMMUNITY)
Admission: EM | Admit: 2020-11-12 | Discharge: 2020-11-13 | Disposition: A | Discharge status: Home / Self Care | Payer: No Typology Code available for payment source | Attending: Emergency Medicine | Admitting: Emergency Medicine

## 2020-11-12 ENCOUNTER — Other Ambulatory Visit: Payer: Self-pay

## 2020-11-12 DIAGNOSIS — Z113 Encounter for screening for infections with a predominantly sexual mode of transmission: Secondary | ICD-10-CM | POA: Insufficient documentation

## 2020-11-12 DIAGNOSIS — R300 Dysuria: Secondary | ICD-10-CM | POA: Insufficient documentation

## 2020-11-12 DIAGNOSIS — R369 Urethral discharge, unspecified: Secondary | ICD-10-CM | POA: Insufficient documentation

## 2020-11-12 DIAGNOSIS — F17210 Nicotine dependence, cigarettes, uncomplicated: Secondary | ICD-10-CM | POA: Insufficient documentation

## 2020-11-12 NOTE — ED Triage Notes (Signed)
Pt said some discharge from his penis and painful around the meatus. No urination difficulty or pain

## 2020-11-13 LAB — URINALYSIS, ROUTINE W REFLEX MICROSCOPIC
Bacteria, UA: NONE SEEN
Bilirubin Urine: NEGATIVE
Glucose, UA: NEGATIVE mg/dL
Hgb urine dipstick: NEGATIVE
Ketones, ur: NEGATIVE mg/dL
Nitrite: NEGATIVE
Protein, ur: NEGATIVE mg/dL
Specific Gravity, Urine: 1.013 (ref 1.005–1.030)
WBC, UA: >50 WBC/hpf — ABNORMAL HIGH (ref 0–5)
pH: 8 (ref 5.0–8.0)

## 2020-11-13 MED ORDER — LIDOCAINE HCL (PF) 1 % IJ SOLN
2.0000 mL | Freq: Once | INTRAMUSCULAR | Status: AC
  Administered 2020-11-13: 2 mL

## 2020-11-13 MED ORDER — CEFTRIAXONE SODIUM 500 MG IJ SOLR
500.0000 mg | Freq: Once | INTRAMUSCULAR | Status: AC
  Administered 2020-11-13: 500 mg via INTRAMUSCULAR
  Filled 2020-11-13: qty 500

## 2020-11-13 MED ORDER — LIDOCAINE HCL (PF) 1 % IJ SOLN
INTRAMUSCULAR | Status: AC
  Filled 2020-11-13: qty 5

## 2020-11-13 MED ORDER — AZITHROMYCIN 250 MG PO TABS
1000.0000 mg | ORAL_TABLET | Freq: Once | ORAL | Status: AC
  Administered 2020-11-13: 1000 mg via ORAL
  Filled 2020-11-13: qty 4
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
## 2020-11-13 NOTE — ED Notes (Signed)
Urine culture sent down with u/a 

## 2020-11-25 ENCOUNTER — Other Ambulatory Visit: Payer: Self-pay

## 2020-11-25 ENCOUNTER — Ambulatory Visit (HOSPITAL_COMMUNITY)
Admission: EM | Admit: 2020-11-25 | Discharge: 2020-11-25 | Disposition: A | Discharge status: Home / Self Care | Payer: No Typology Code available for payment source | Attending: Physician Assistant | Admitting: Physician Assistant

## 2020-11-25 ENCOUNTER — Encounter (HOSPITAL_COMMUNITY): Payer: Self-pay | Admitting: *Deleted

## 2020-11-25 DIAGNOSIS — S0990XA Unspecified injury of head, initial encounter: Secondary | ICD-10-CM | POA: Diagnosis not present

## 2020-11-25 DIAGNOSIS — S0083XA Contusion of other part of head, initial encounter: Secondary | ICD-10-CM

## 2020-11-25 DIAGNOSIS — S0081XA Abrasion of other part of head, initial encounter: Secondary | ICD-10-CM | POA: Diagnosis not present

## 2020-11-25 DIAGNOSIS — Z23 Encounter for immunization: Secondary | ICD-10-CM | POA: Diagnosis not present

## 2020-11-25 MED ORDER — TETANUS-DIPHTH-ACELL PERTUSSIS 5-2.5-18.5 LF-MCG/0.5 IM SUSY
0.5000 mL | PREFILLED_SYRINGE | Freq: Once | INTRAMUSCULAR | Status: AC
  Administered 2020-11-25: 12:00:00 0.5 mL via INTRAMUSCULAR

## 2020-11-25 MED ORDER — TETANUS-DIPHTH-ACELL PERTUSSIS 5-2.5-18.5 LF-MCG/0.5 IM SUSY
PREFILLED_SYRINGE | INTRAMUSCULAR | Status: AC
  Filled 2020-11-25: qty 0.5

## 2020-11-25 NOTE — Discharge Instructions (Signed)
There is low concern for any major head injury or intracranial abnormality at this time given her normal neurological exam and reported mild headache without any additional neurological symptoms.  At this time ice the area of swelling every couple hours throughout 50 minutes at a time over the next 2 to 3 days.  You can take Tylenol as needed for pain relief.  You may have some generalized body aches tomorrow.  Tylenol should help this and you can also try heat to the areas that hurt.  Tetanus updated today.  Make sure you keep your wounds clean.  Clean with soap and water and you can apply Neosporin.  Follow-up with our department if you have any signs of infection including pustular drainage, increased redness or pain.  Go to emergency department if you have any severe headaches, dizziness, weakness, you pass out or feel faint, vomiting, confusion, numbness/weakness or tingling, balance or speech problems.  Have someone stay with you over the next 24 hours to monitor you.  Call EMS if your symptoms greatly worsen.
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
## 2023-04-11 ENCOUNTER — Institutional Professional Consult (permissible substitution): Payer: No Typology Code available for payment source | Admitting: Nurse Practitioner

## 2023-04-24 ENCOUNTER — Institutional Professional Consult (permissible substitution): Payer: Medicaid Other | Admitting: Nurse Practitioner

## 2023-05-16 ENCOUNTER — Encounter: Payer: Self-pay | Admitting: Internal Medicine
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
## 2023-10-13 ENCOUNTER — Institutional Professional Consult (permissible substitution): Payer: No Typology Code available for payment source | Admitting: Internal Medicine

## 2023-10-14 ENCOUNTER — Encounter: Payer: Self-pay | Admitting: Internal Medicine

## 2025-02-02 ENCOUNTER — Telehealth

## 2025-02-02 ENCOUNTER — Other Ambulatory Visit: Payer: Self-pay

## 2025-02-02 DIAGNOSIS — B353 Tinea pedis: Secondary | ICD-10-CM

## 2025-02-02 MED ORDER — TERBINAFINE HCL 250 MG PO TABS
250.0000 mg | ORAL_TABLET | Freq: Every day | ORAL | 0 refills | Status: AC
  Filled 2025-02-02: qty 7, 7d supply, fill #0

## 2025-02-02 NOTE — Progress Notes (Signed)
"           E-Visit for Athlete's Foot  We are sorry that you are not feeling well. Here is how we plan to help!  Based on what you shared with me, it looks like you have tinea pedis, or Athletes Foot.  This type of rash is caused by a fungus and can be spread through shared towels, clothing, bedding, etc., as well as hard surfaces (particularly in moist areas) such as shower stalls, locker room floors, pool areas, etc.  The symptoms of athletes foot include redness, swelling, itching, peeling and flaking of the skin between the toes.  The sole and heel of the foot may also be affected. In severe cases, the skin on the feet can blister.   Athletes foot can usually be treated with over-the-counter topical antifungal products. Prescription medications are only indicated for an extensive rash or if over the counter treatments have failed.    Because you are having severe/extensive symptoms, I am prescribing:Terbinafine  250 mg once per day for one week.  HOME CARE:  Keep your feet clean, dry, and cool. Avoid using swimming pools, public showers, or foot baths. Wear sandals when possible or help air your shoes out by alternating them every 2-3 days. Avoid wearing closed shoes and wearing socks made from fabric that doesnt dry easily (for example, nylon). Treat the infection with recommended medication.  GET HELP RIGHT AWAY IF:  Symptoms that dont go away after treatment. Severe itching that persists. If your rash spreads or swells. If your rash begins to have drainage or smell. You develop a fever.  MAKE SURE YOU   Understand these instructions. Will continue to monitor your condition for changes. Will get help right away if you are not doing well or get worse.   Thank you for choosing an e-visit.   Your e-visit answers were reviewed by a board certified advanced clinical practitioner to complete your personal care plan. Depending upon the condition, your plan could have included  both over the counter or prescription medications.   Please review your pharmacy choice. Make sure the pharmacy is open so you can pick up the prescription now. If there is a problem, you may contact your provider through [employer] and have the prescription routed to another pharmacy.   Your safety is important to us . If you have drug allergies, check your prescription carefully.    For the next 24 hours you can use MyChart to ask questions about todays visit, request a non-urgent call back, or ask for a work or school excuse.   You will receive an email in the next two days asking about your experience. I hope that your e-visit has been valuable and will speed your recovery   References or for more information:  fatmenus.com.[email or web address]?search=[email or web address]&source=search_result&selectedTitle=1~104&usage_type=default&display_rank=1  [email or web address]    I have spent 5 minutes in review of e-visit questionnaire, review and updating patient chart, medical decision making and response to patient.   Hadassah Fireman, NP      "
